# Patient Record
Sex: Male | Born: 1945 | Race: White | Hispanic: No | Marital: Married | Smoking: Never smoker
Health system: Southern US, Community
[De-identification: ages and names within clinical notes are randomized; demographics above are authoritative.]

## PROBLEM LIST (undated history)

## (undated) DIAGNOSIS — I10 Essential (primary) hypertension: Secondary | ICD-10-CM

## (undated) DIAGNOSIS — E7800 Pure hypercholesterolemia, unspecified: Secondary | ICD-10-CM

## (undated) DIAGNOSIS — C801 Malignant (primary) neoplasm, unspecified: Secondary | ICD-10-CM

## (undated) HISTORY — PX: TONSILLECTOMY: SUR1361

---

## 2011-04-12 ENCOUNTER — Ambulatory Visit: Payer: Self-pay | Admitting: Internal Medicine

## 2011-11-02 ENCOUNTER — Other Ambulatory Visit: Payer: Self-pay | Admitting: Urology

## 2011-12-09 ENCOUNTER — Encounter (HOSPITAL_COMMUNITY): Payer: Self-pay | Admitting: Pharmacy Technician

## 2011-12-16 ENCOUNTER — Other Ambulatory Visit: Payer: Self-pay

## 2011-12-16 ENCOUNTER — Ambulatory Visit (HOSPITAL_COMMUNITY)
Admission: RE | Admit: 2011-12-16 | Discharge: 2011-12-16 | Disposition: A | Discharge status: Home / Self Care | Payer: BC Managed Care – PPO | Source: Ambulatory Visit | Attending: Urology | Admitting: Urology

## 2011-12-16 ENCOUNTER — Encounter (HOSPITAL_COMMUNITY)
Admission: RE | Admit: 2011-12-16 | Discharge: 2011-12-16 | Disposition: A | Discharge status: Home / Self Care | Payer: BC Managed Care – PPO | Source: Ambulatory Visit | Attending: Urology | Admitting: Urology

## 2011-12-16 ENCOUNTER — Encounter (HOSPITAL_COMMUNITY): Payer: Self-pay

## 2011-12-16 DIAGNOSIS — Z01818 Encounter for other preprocedural examination: Secondary | ICD-10-CM | POA: Insufficient documentation

## 2011-12-16 DIAGNOSIS — Z01812 Encounter for preprocedural laboratory examination: Secondary | ICD-10-CM | POA: Insufficient documentation

## 2011-12-16 HISTORY — DX: Essential (primary) hypertension: I10

## 2011-12-16 HISTORY — DX: Malignant (primary) neoplasm, unspecified: C80.1

## 2011-12-16 LAB — SURGICAL PCR SCREEN: MRSA, PCR: NEGATIVE

## 2011-12-16 LAB — BASIC METABOLIC PANEL
CO2: 28 mEq/L (ref 19–32)
Calcium: 9.6 mg/dL (ref 8.4–10.5)
Chloride: 102 mEq/L (ref 96–112)
Creatinine, Ser: 0.97 mg/dL (ref 0.50–1.35)
GFR calc Af Amer: >90 mL/min (ref >90)
Sodium: 138 mEq/L (ref 135–145)

## 2011-12-16 LAB — CBC
Platelets: 155 10*3/uL (ref 150–400)
RBC: 4.58 MIL/uL (ref 4.22–5.81)
RDW: 12.6 % (ref 11.5–15.5)
WBC: 5.4 10*3/uL (ref 4.0–10.5)

## 2011-12-16 NOTE — Progress Notes (Signed)
CXR and EKG in Epic from 12-16-2011

## 2011-12-16 NOTE — Progress Notes (Signed)
12/16/11 0829  OBSTRUCTIVE SLEEP APNEA  Have you ever been diagnosed with sleep apnea through a sleep study? No  Do you snore loudly (loud enough to be heard through closed doors)?  1  Do you often feel tired, fatigued, or sleepy during the daytime? 0  Has anyone observed you stop breathing during your sleep? 0  Do you have, or are you being treated for high blood pressure? 1  BMI more than 35 kg/m2? 0  Age over 66 years old? 1  Neck circumference greater than 40 cm/18 inches? 0  Gender: 1  Obstructive Sleep Apnea Score 4   Score 4 or greater  Results sent to PCP

## 2011-12-16 NOTE — Pre-Procedure Instructions (Signed)
20 Andre Arias  12/16/2011   Your procedure is scheduled on:  Wednesday 12-23-2011  Report to [facility] at  AM.  0600    Call this number if you have problems the morning of surgery: [phone]   Remember:   Do not drink liquids or  eat food:After Midnight Tuesday night      Take these medicines the morning of surgery with A SIP OF WATER: None   Do not wear jewelry, make-up or nail polish.  Do not wear lotions, powders, or perfumes. You may wear deodorant.  Do not shave 48 hours prior to surgery. Men may shave face and neck.  Do not bring valuables to the hospital.  Contacts, dentures or bridgework may not be worn into surgery.  Leave suitcase in the car. After surgery it may be brought to your room.  For patients admitted to the hospital, checkout time is 11:00 AM the day of discharge.   Patients discharged the day of surgery will not be allowed to drive home.  Name and phone number of your driver:  [name] wife [phone]  See [facility] Preparing for surgery sheet.   Please read over the following fact sheets that you were given: MRSA Information,Blood transfusion

## 2011-12-23 ENCOUNTER — Encounter (HOSPITAL_COMMUNITY): Payer: Self-pay | Admitting: Anesthesiology

## 2011-12-23 ENCOUNTER — Encounter (HOSPITAL_COMMUNITY): Payer: Self-pay | Admitting: *Deleted

## 2011-12-23 ENCOUNTER — Encounter (HOSPITAL_COMMUNITY)
Admission: RE | Disposition: A | Discharge status: Home / Self Care | Payer: Self-pay | Source: Ambulatory Visit | Attending: Urology

## 2011-12-23 ENCOUNTER — Inpatient Hospital Stay (HOSPITAL_COMMUNITY): Payer: BC Managed Care – PPO | Admitting: Anesthesiology

## 2011-12-23 ENCOUNTER — Inpatient Hospital Stay (HOSPITAL_COMMUNITY)
Admission: RE | Admit: 2011-12-23 | Discharge: 2011-12-25 | DRG: 334 | Disposition: A | Discharge status: Home / Self Care | Payer: BC Managed Care – PPO | Source: Ambulatory Visit | Attending: Urology | Admitting: Urology

## 2011-12-23 DIAGNOSIS — K560 Paralytic ileus: Secondary | ICD-10-CM | POA: Diagnosis not present

## 2011-12-23 DIAGNOSIS — D62 Acute posthemorrhagic anemia: Secondary | ICD-10-CM

## 2011-12-23 DIAGNOSIS — C61 Malignant neoplasm of prostate: Principal | ICD-10-CM | POA: Diagnosis present

## 2011-12-23 DIAGNOSIS — K567 Ileus, unspecified: Secondary | ICD-10-CM

## 2011-12-23 DIAGNOSIS — I10 Essential (primary) hypertension: Secondary | ICD-10-CM | POA: Diagnosis present

## 2011-12-23 HISTORY — PX: LYMPHADENECTOMY: SHX5960

## 2011-12-23 HISTORY — PX: ROBOT ASSISTED LAPAROSCOPIC RADICAL PROSTATECTOMY: SHX5141

## 2011-12-23 LAB — BASIC METABOLIC PANEL
BUN: 12 mg/dL (ref 6–23)
CO2: 25 mEq/L (ref 19–32)
GFR calc non Af Amer: 70 mL/min — ABNORMAL LOW (ref >90)
Glucose, Bld: 155 mg/dL — ABNORMAL HIGH (ref 70–99)
Potassium: 4.1 mEq/L (ref 3.5–5.1)
Sodium: 134 mEq/L — ABNORMAL LOW (ref 135–145)

## 2011-12-23 LAB — TYPE AND SCREEN
ABO/RH(D): O NEG
Antibody Screen: NEGATIVE

## 2011-12-23 LAB — ABO/RH: ABO/RH(D): O NEG

## 2011-12-23 LAB — HEMOGLOBIN AND HEMATOCRIT, BLOOD: HCT: 33 % — ABNORMAL LOW (ref 39.0–52.0)

## 2011-12-23 SURGERY — ROBOTIC ASSISTED LAPAROSCOPIC RADICAL PROSTATECTOMY LEVEL 2
Anesthesia: General | Site: Pelvis | Wound class: Clean Contaminated

## 2011-12-23 MED ORDER — BUPIVACAINE LIPOSOME 1.3 % IJ SUSP
20.0000 mL | INTRAMUSCULAR | Status: AC
  Administered 2011-12-23: 20 mL
  Filled 2011-12-23: qty 20

## 2011-12-23 MED ORDER — GLYCOPYRROLATE 0.2 MG/ML IJ SOLN
INTRAMUSCULAR | Status: DC | PRN
  Administered 2011-12-23: 0.6 mg via INTRAVENOUS
  Administered 2011-12-23: 0.2 mg via INTRAVENOUS

## 2011-12-23 MED ORDER — SODIUM CHLORIDE 0.9 % IV BOLUS (SEPSIS)
1000.0000 mL | Freq: Once | INTRAVENOUS | Status: AC
  Administered 2011-12-23: 1000 mL via INTRAVENOUS

## 2011-12-23 MED ORDER — NEOSTIGMINE METHYLSULFATE 1 MG/ML IJ SOLN
INTRAMUSCULAR | Status: DC | PRN
  Administered 2011-12-23: 5 mg via INTRAVENOUS

## 2011-12-23 MED ORDER — SODIUM CHLORIDE 0.9 % IR SOLN
Status: DC | PRN
  Administered 2011-12-23: 300 mL via INTRAVESICAL

## 2011-12-23 MED ORDER — HYDROMORPHONE HCL PF 1 MG/ML IJ SOLN
INTRAMUSCULAR | Status: DC | PRN
  Administered 2011-12-23 (×4): 0.5 mg via INTRAVENOUS

## 2011-12-23 MED ORDER — LACTATED RINGERS IR SOLN
Status: DC | PRN
Start: 2011-12-23 — End: 2011-12-23
  Administered 2011-12-23: 1000 mL

## 2011-12-23 MED ORDER — ONDANSETRON HCL 4 MG/2ML IJ SOLN
4.0000 mg | Freq: Once | INTRAMUSCULAR | Status: AC
  Administered 2011-12-23: 4 mg via INTRAVENOUS

## 2011-12-23 MED ORDER — HETASTARCH-ELECTROLYTES 6 % IV SOLN
INTRAVENOUS | Status: DC | PRN
  Administered 2011-12-23: 14:00:00 via INTRAVENOUS

## 2011-12-23 MED ORDER — HYDROCHLOROTHIAZIDE 12.5 MG PO CAPS
12.5000 mg | ORAL_CAPSULE | Freq: Every day | ORAL | Status: DC
  Administered 2011-12-24 – 2011-12-25 (×2): 12.5 mg via ORAL
  Filled 2011-12-23 (×2): qty 1

## 2011-12-23 MED ORDER — INDIGOTINDISULFONATE SODIUM 8 MG/ML IJ SOLN
INTRAMUSCULAR | Status: DC | PRN
  Administered 2011-12-23 (×2): 5 mL via INTRAVENOUS

## 2011-12-23 MED ORDER — MORPHINE SULFATE 2 MG/ML IJ SOLN
2.0000 mg | INTRAMUSCULAR | Status: DC | PRN
  Administered 2011-12-23 – 2011-12-24 (×2): 2 mg via INTRAVENOUS
  Filled 2011-12-23 (×2): qty 1

## 2011-12-23 MED ORDER — LIDOCAINE HCL (CARDIAC) 20 MG/ML IV SOLN
INTRAVENOUS | Status: DC | PRN
  Administered 2011-12-23: 30 mg via INTRAVENOUS

## 2011-12-23 MED ORDER — PROMETHAZINE HCL 25 MG/ML IJ SOLN: 6.2500 mg | INTRAMUSCULAR | Status: DC | PRN

## 2011-12-23 MED ORDER — CEFAZOLIN SODIUM-DEXTROSE 2-3 GM-% IV SOLR
2.0000 g | INTRAVENOUS | Status: AC
  Administered 2011-12-23 (×2): 2 g via INTRAVENOUS

## 2011-12-23 MED ORDER — SODIUM CHLORIDE 0.45 % IV SOLN
INTRAVENOUS | Status: DC
  Administered 2011-12-23: 21:00:00 via INTRAVENOUS

## 2011-12-23 MED ORDER — DEXAMETHASONE SODIUM PHOSPHATE 4 MG/ML IJ SOLN
INTRAMUSCULAR | Status: DC | PRN
  Administered 2011-12-23: 10 mg via INTRAVENOUS

## 2011-12-23 MED ORDER — HEPARIN SODIUM (PORCINE) 5000 UNIT/ML IJ SOLN
INTRAMUSCULAR | Status: DC | PRN
  Administered 2011-12-23: 1000 [IU]

## 2011-12-23 MED ORDER — FENTANYL CITRATE 0.05 MG/ML IJ SOLN
INTRAMUSCULAR | Status: DC | PRN
  Administered 2011-12-23 (×3): 50 ug via INTRAVENOUS
  Administered 2011-12-23 (×2): 25 ug via INTRAVENOUS
  Administered 2011-12-23 (×4): 50 ug via INTRAVENOUS
  Administered 2011-12-23 (×2): 25 ug via INTRAVENOUS
  Administered 2011-12-23 (×3): 50 ug via INTRAVENOUS
  Administered 2011-12-23 (×2): 25 ug via INTRAVENOUS

## 2011-12-23 MED ORDER — HYDROMORPHONE HCL PF 1 MG/ML IJ SOLN
0.2500 mg | INTRAMUSCULAR | Status: DC | PRN
  Administered 2011-12-23 (×2): 0.5 mg via INTRAVENOUS

## 2011-12-23 MED ORDER — CISATRACURIUM BESYLATE (PF) 10 MG/5ML IV SOLN
INTRAVENOUS | Status: DC | PRN
  Administered 2011-12-23: 3 mg via INTRAVENOUS
  Administered 2011-12-23: 1 mg via INTRAVENOUS
  Administered 2011-12-23: 4 mg via INTRAVENOUS
  Administered 2011-12-23 (×3): 3 mg via INTRAVENOUS
  Administered 2011-12-23: 10 mg via INTRAVENOUS
  Administered 2011-12-23 (×2): 3 mg via INTRAVENOUS
  Administered 2011-12-23: 1 mg via INTRAVENOUS
  Administered 2011-12-23: 2 mg via INTRAVENOUS
  Administered 2011-12-23: 3 mg via INTRAVENOUS
  Administered 2011-12-23: 2 mg via INTRAVENOUS

## 2011-12-23 MED ORDER — SUCCINYLCHOLINE CHLORIDE 20 MG/ML IJ SOLN
INTRAMUSCULAR | Status: DC | PRN
  Administered 2011-12-23: 100 mg via INTRAVENOUS

## 2011-12-23 MED ORDER — ONDANSETRON HCL 4 MG/2ML IJ SOLN
INTRAMUSCULAR | Status: DC | PRN
  Administered 2011-12-23 (×2): 2 mg via INTRAVENOUS

## 2011-12-23 MED ORDER — ACETAMINOPHEN 10 MG/ML IV SOLN
1000.0000 mg | Freq: Four times a day (QID) | INTRAVENOUS | Status: AC
  Administered 2011-12-24 (×3): 1000 mg via INTRAVENOUS
  Filled 2011-12-23 (×4): qty 100

## 2011-12-23 MED ORDER — LACTATED RINGERS IV SOLN
INTRAVENOUS | Status: DC | PRN
  Administered 2011-12-23 (×3): via INTRAVENOUS

## 2011-12-23 MED ORDER — MIDAZOLAM HCL 5 MG/5ML IJ SOLN
INTRAMUSCULAR | Status: DC | PRN
  Administered 2011-12-23: 0.5 mg via INTRAVENOUS
  Administered 2011-12-23: 1 mg via INTRAVENOUS

## 2011-12-23 MED ORDER — ACETAMINOPHEN 10 MG/ML IV SOLN
INTRAVENOUS | Status: DC | PRN
  Administered 2011-12-23: 1000 mg via INTRAVENOUS

## 2011-12-23 MED ORDER — HYDROCODONE-ACETAMINOPHEN 5-325 MG PO TABS
1.0000 | ORAL_TABLET | ORAL | Status: DC | PRN
  Administered 2011-12-23: 1 via ORAL
  Filled 2011-12-23: qty 1

## 2011-12-23 MED ORDER — EPHEDRINE SULFATE 50 MG/ML IJ SOLN
INTRAMUSCULAR | Status: DC | PRN
  Administered 2011-12-23: 5 mg via INTRAVENOUS
  Administered 2011-12-23 (×2): 10 mg via INTRAVENOUS
  Administered 2011-12-23: 5 mg via INTRAVENOUS
  Administered 2011-12-23 (×2): 10 mg via INTRAVENOUS
  Administered 2011-12-23 (×3): 5 mg via INTRAVENOUS

## 2011-12-23 MED ORDER — PROPOFOL 10 MG/ML IV EMUL
INTRAVENOUS | Status: DC | PRN
  Administered 2011-12-23: 200 mg via INTRAVENOUS

## 2011-12-23 MED ORDER — CEFAZOLIN SODIUM-DEXTROSE 2-3 GM-% IV SOLR: 2.0000 g | Freq: Once | INTRAVENOUS | Status: DC

## 2011-12-23 MED ORDER — LISINOPRIL 10 MG PO TABS
10.0000 mg | ORAL_TABLET | Freq: Every day | ORAL | Status: DC
  Administered 2011-12-24 – 2011-12-25 (×2): 10 mg via ORAL
  Filled 2011-12-23 (×2): qty 1

## 2011-12-23 MED ORDER — LISINOPRIL-HYDROCHLOROTHIAZIDE 10-12.5 MG PO TABS: 1.0000 | ORAL_TABLET | Freq: Every morning | ORAL | Status: DC

## 2011-12-23 MED ORDER — HEPARIN SODIUM (PORCINE) 5000 UNIT/ML IJ SOLN
5000.0000 [IU] | INTRAMUSCULAR | Status: AC
  Administered 2011-12-23: 5000 [IU] via SUBCUTANEOUS
  Filled 2011-12-23: qty 1

## 2011-12-23 SURGICAL SUPPLY — 52 items
APPLIER CLIP 5 13 M/L LIGAMAX5 (MISCELLANEOUS) ×3
CANISTER SUCTION 2500CC (MISCELLANEOUS) ×3 IMPLANT
CATH FOLEY 2WAY SLVR  5CC 16FR (CATHETERS) ×1
CATH FOLEY 2WAY SLVR 30CC 16FR (CATHETERS) ×3 IMPLANT
CATH FOLEY 2WAY SLVR 5CC 16FR (CATHETERS) ×2 IMPLANT
CATH ROBINSON RED A/P 8FR (CATHETERS) ×3 IMPLANT
CATH TIEMANN FOLEY 18FR 5CC (CATHETERS) ×6 IMPLANT
CHLORAPREP W/TINT 26ML (MISCELLANEOUS) ×3 IMPLANT
CLIP APPLIE 5 13 M/L LIGAMAX5 (MISCELLANEOUS) ×2 IMPLANT
CLIP LIGATING HEM O LOK PURPLE (MISCELLANEOUS) ×12 IMPLANT
CLOTH BEACON ORANGE TIMEOUT ST (SAFETY) ×3 IMPLANT
CORD HIGH FREQUENCY UNIPOLAR (ELECTROSURGICAL) IMPLANT
COVER SURGICAL LIGHT HANDLE (MISCELLANEOUS) ×3 IMPLANT
COVER TIP SHEARS 8 DVNC (MISCELLANEOUS) ×2 IMPLANT
COVER TIP SHEARS 8MM DA VINCI (MISCELLANEOUS) ×1
CUTTER ECHEON FLEX ENDO 45 340 (ENDOMECHANICALS) ×3 IMPLANT
DECANTER SPIKE VIAL GLASS SM (MISCELLANEOUS) ×3 IMPLANT
DRAPE SURG IRRIG POUCH 19X23 (DRAPES) ×3 IMPLANT
DRSG TEGADERM 6X8 (GAUZE/BANDAGES/DRESSINGS) ×6 IMPLANT
ELECT REM PT RETURN 9FT ADLT (ELECTROSURGICAL) ×3
ELECTRODE REM PT RTRN 9FT ADLT (ELECTROSURGICAL) ×2 IMPLANT
GLOVE BIOGEL M 7.0 STRL (GLOVE) IMPLANT
GLOVE BIOGEL PI IND STRL 7.5 (GLOVE) ×4 IMPLANT
GLOVE BIOGEL PI INDICATOR 7.5 (GLOVE) ×2
GLOVE ECLIPSE 7.0 STRL STRAW (GLOVE) ×6 IMPLANT
GOWN PREVENTION PLUS XLARGE (GOWN DISPOSABLE) ×6 IMPLANT
GOWN STRL NON-REIN LRG LVL3 (GOWN DISPOSABLE) ×18 IMPLANT
HEMOSTAT SURGICEL 2X14 (HEMOSTASIS) ×3 IMPLANT
HOLDER FOLEY CATH W/STRAP (MISCELLANEOUS) ×3 IMPLANT
IV LACTATED RINGERS 1000ML (IV SOLUTION) ×3 IMPLANT
KIT ACCESSORY DA VINCI DISP (KITS) ×1
KIT ACCESSORY DVNC DISP (KITS) ×2 IMPLANT
NDL SAFETY ECLIPSE 18X1.5 (NEEDLE) ×2 IMPLANT
NEEDLE HYPO 18GX1.5 SHARP (NEEDLE) ×1
PACK ROBOT UROLOGY CUSTOM (CUSTOM PROCEDURE TRAY) ×3 IMPLANT
POSITIONER SURGICAL ARM (MISCELLANEOUS) IMPLANT
RELOAD GREEN ECHELON 45 (STAPLE) ×3 IMPLANT
SET TUBE IRRIG SUCTION NO TIP (IRRIGATION / IRRIGATOR) ×3 IMPLANT
SOLUTION ELECTROLUBE (MISCELLANEOUS) ×3 IMPLANT
SPONGE GAUZE 4X4 12PLY (GAUZE/BANDAGES/DRESSINGS) ×3 IMPLANT
SUT ETHILON 3 0 PS 1 (SUTURE) ×3 IMPLANT
SUT VIC AB 2-0 SH 27 (SUTURE) ×3
SUT VIC AB 2-0 SH 27X BRD (SUTURE) ×6 IMPLANT
SUT VIC AB 3-0 SH 27 (SUTURE) ×4
SUT VIC AB 3-0 SH 27X BRD (SUTURE) ×6 IMPLANT
SUT VIC AB 3-0 SH 27XBRD (SUTURE) ×2 IMPLANT
SUT VICRYL 0 UR6 27IN ABS (SUTURE) ×6 IMPLANT
SUT VICRYL 3 0 UR 6 27 (SUTURE) ×6 IMPLANT
SYR 27GX1/2 1ML LL SAFETY (SYRINGE) ×3 IMPLANT
TOWEL OR 17X26 10 PK STRL BLUE (TOWEL DISPOSABLE) ×3 IMPLANT
TOWEL OR NON WOVEN STRL DISP B (DISPOSABLE) ×3 IMPLANT
WATER STERILE IRR 1500ML POUR (IV SOLUTION) ×6 IMPLANT

## 2011-12-23 NOTE — H&P (Signed)
Urology History and Physical Exam  CC: Prostate cancer  HPI: 66 year old male presents with prostate cancer for robotic laparoscopic radical prostatectomy and bilateral pelvic lymph node dissection with nerve sparing to be determined at the time of surgery. This was diagnosed on prostate biopsy in August 2013. Final Gleason score was 4+3 equals 7 in one core and 3+3 equals 6 in one core. Prostate volume was 75 cc. PSA was 11 in July 2013. CT of the pelvis in August 2013 for staging showed no evidence of metastatic disease. He did have prominent seminal vesicles and a large median lobe. Staging studies with labs showed no evidence of metastatic disease. This has not been associated with gross hematuria. Nothing makes it better or worse.  We have discussed the risks, benefits, alternatives, and likelihood of achieving his goals.  PMH: Past Medical History  Diagnosis Date  . Hypertension   . Cancer     prostate    PSH: Past Surgical History  Procedure Date  . Tonsillectomy     as child    Allergies: No Known Allergies  Medications: Prescriptions prior to admission  Medication Sig Dispense Refill  . lisinopril-hydrochlorothiazide (PRINZIDE,ZESTORETIC) 10-12.5 MG per tablet Take 1 tablet by mouth every morning.      . Tamsulosin HCl (FLOMAX) 0.4 MG CAPS Take 0.4 mg by mouth daily.      . Multiple Vitamin (MULTIVITAMIN WITH MINERALS) TABS Take 1 tablet by mouth daily.         Social History: History   Social History  . Marital Status: Married    Spouse Name: N/A    Number of Children: N/A  . Years of Education: N/A   Occupational History  . Not on file.   Social History Main Topics  . Smoking status: Never Smoker   . Smokeless tobacco: Never Used  . Alcohol Use: No  . Drug Use: No  . Sexually Active:    Other Topics Concern  . Not on file   Social History Narrative  . No narrative on file    Family History: History reviewed. No pertinent family  history.  Review of Systems: Positive: None. Negative: Fever, chest pain, or SOB.  A further 10 point review of systems was negative except what is listed in the HPI.  Physical Exam: Filed Vitals:   12/23/11 0604  BP: 162/83  Pulse: 66  Temp: 97.8 F (36.6 C)  Resp: 18    General: No acute distress.  Awake. Head:  Normocephalic.  Atraumatic. ENT:  EOMI.  Mucous membranes moist Neck:  Supple.  No lymphadenopathy. Pulmonary: Equal effort bilaterally.  Clear to auscultation bilaterally. Abdomen: Soft.  Non- tender to palpation. Skin:  Normal turgor.  No visible rash. Extremity: No gross deformity of bilateral upper extremities.  No gross deformity of    bilateral lower extremities. Neurologic: Alert. Appropriate mood.    Studies:  No results found for this basename: HGB:2,WBC:2,PLT:2 in the last 72 hours  No results found for this basename: NA:2,K:2,CL:2,CO2:2,BUN:2,CREATININE:2,CALCIUM:2,MAGNESIUM:2,GFRNONAA:2,GFRAA:2 in the last 72 hours   No results found for this basename: PT:2,INR:2,APTT:2 in the last 72 hours   No components found with this basename: ABG:2    Assessment:  Prostate cancer  Plan: To OR for robotic laparoscopic radical prostatectomy and bilateral pelvic lymph node dissection with nerve sparing to be determined at the time of surgery.

## 2011-12-23 NOTE — Transfer of Care (Signed)
Immediate Anesthesia Transfer of Care Note  Patient: Andre Arias  Procedure(s) Performed: Procedure(s) (LRB) with comments: ROBOTIC ASSISTED LAPAROSCOPIC RADICAL PROSTATECTOMY LEVEL 2 (N/A) -    LYMPHADENECTOMY (Bilateral)  Patient Location: PACU  Anesthesia Type:General  Level of Consciousness: awake, sedated and patient cooperative  Airway & Oxygen Therapy: Patient Spontanous Breathing and Patient connected to face mask oxygen  Post-op Assessment: Report given to PACU RN and Post -op Vital signs reviewed and stable  Post vital signs: Reviewed and stable  Complications: No apparent anesthesia complications

## 2011-12-23 NOTE — Anesthesia Preprocedure Evaluation (Signed)
Anesthesia Evaluation  Patient identified by MRN, date of birth, ID band Patient awake    Reviewed: Allergy & Precautions, H&P , NPO status , Patient's Chart, lab work & pertinent test results  Airway Mallampati: II TM Distance: >3 FB Neck ROM: Full    Dental No notable dental hx.    Pulmonary neg pulmonary ROS,  breath sounds clear to auscultation  Pulmonary exam normal       Cardiovascular hypertension, Pt. on medications Rhythm:Regular Rate:Normal     Neuro/Psych negative neurological ROS  negative psych ROS   GI/Hepatic negative GI ROS, Neg liver ROS,   Endo/Other  negative endocrine ROS  Renal/GU negative Renal ROS  negative genitourinary   Musculoskeletal negative musculoskeletal ROS (+)   Abdominal   Peds negative pediatric ROS (+)  Hematology negative hematology ROS (+)   Anesthesia Other Findings   Reproductive/Obstetrics negative OB ROS                           Anesthesia Physical Anesthesia Plan  ASA: II  Anesthesia Plan: General   Post-op Pain Management:    Induction: Intravenous  Airway Management Planned: Oral ETT  Additional Equipment:   Intra-op Plan:   Post-operative Plan: Extubation in OR  Informed Consent: I have reviewed the patients History and Physical, chart, labs and discussed the procedure including the risks, benefits and alternatives for the proposed anesthesia with the patient or authorized representative who has indicated his/her understanding and acceptance.   Dental advisory given  Plan Discussed with: CRNA and Surgeon  Anesthesia Plan Comments:         Anesthesia Quick Evaluation  

## 2011-12-23 NOTE — Op Note (Signed)
Urology Operative Report  DATE OF PROCEDURE: 12/23/11   OPERATIVE REPORT  SURGEON: Natalia Leatherwood, MD  ASSISTANT:  Pecola Leisure, PA  PREOPERATIVE DIAGNOSIS: Prostate cancer.  POSTOPERATIVE DIAGNOSIS: Prostate cancer.  PROCEDURE PERFORMED: Robotic-assisted laparoscopic radical  Prostatectomy,bilateral pelvic lymph node dissection, bilateral non-nerve sparing approach.  ESTIMATED BLOOD LOSS: 500 cc.  DRAINS: Jackson-Pratt drain, left lower quadrant and Foley catheter.  SPECIMEN: Prostate sent with seminal vesicles in its entirety.  FINDINGS: Large periprostatic veins. Intravesicular prostate.  Marland Kitchen  HISTORY OF PRESENT ILLNESS:  66 year old male had a prostate biopsy due to elevated PSA. He was discovered to have prostate cancer. After evaluation and discussion of management options, he elected for surgical extirpation of his prostate.   PROCEDURE IN DETAIL: Informed consent was obtained. He received subcutaneous heparin for DVT prophylaxis before being taken to the OR. The patient was taken to the operating room, where he was placed in the supine position. IV antibiotics were infused and general anesthesia was induced. A time- out was performed, in which the correct patient, surgical site, and procedure were identified and agreed upon by the team. Hair was removed  from his abdomen and genitals and then he was placed in a dorsal  lithotomy position. All pertinent neurovascular pressure points were padded appropriately. His arms were tucked to the side using gel padding. After this, his abdomen and genitals were prepped and draped in the usual sterile fashion. A Foley catheter was placed on the field, 10 cc of sterile water was placed into the balloon.   Access was gained for laparoscopic procedure by using the Hasson technique by cutting down to the fascia in the midline above the umbilicus. Once the peritoneum was accessed, a 10 mm  port was placed and abdomen was insufflated with carbon  dioxide. Opening pressures were initially low so insufflation was continued. Next, markings were made for placement of the 1st & 3rd robotic arm ports in the usual areas with the ports being placed approximately 10 cm from the midline on either  side of camera (2nd) arm.  A 10 mm assistant port was placed on the far right lateral side of the abdomen. A 5 mm assistant port was placed between the right robotic (1st arm) and the camera port (2nd arm). The 4th robotic arm port was placed at the far left lateral side of the abdomen. All these were placed under direct visualization.   He was then placed in a Trendelenburg position, and the robot was docked to the robotic ports.  After this was done, the urachal remnant was identified and divided and the bladder was  Dissected from the anterior wall of the abdomen. This was taken down to  the endopelvic fascia bilaterally and the perineum was split down to the  vas deferens bilaterally. The vas deferens was divided bipolar and monopolar electrocautery. After this was done, the prograsp was used to  retract the bladder cephalad.   Attention was turned to the right pelvis. The peritoneum was incised until the right external iliac vein was identified. Careful dissection was performed to release the nodal packet lateral to the vein. Dissection was then carried out inferior and medially to the vein until the lateral pelvic wall was encountered. Gentle dissection was then carried proximally and distally along the vein. The obturator nerve was identified early at its proximal and distal ends. Dissection of this packet was carried out making sure not to cause injury to the obturator nerve. Hemostasis and lymph vessels were controlled with  metallic surgical clips. The lymph node packet was then removed and sent for permanent pathology.  Attention was turned to the left pelvis. The peritoneum was incised until the left external iliac vein was identified. Careful  dissection was performed to release the nodal packet lateral to the vein. Dissection was then carried out inferior and medially to the vein until the lateral pelvic wall was encountered. Gentle dissection was then carried proximally and distally along the vein. The obturator nerve was identified early at its proximal and distal ends. Dissection of this packet was carried out making sure not to cause injury to the obturator nerve. Hemostasis and lymph vessels were controlled with metallic surgical clips. The lymph node packet was then removed and sent for permanent pathology.   After this was done, the endopelvic fascia was incised  bilaterally and carried anterior and posteriorly bilaterally. There was a large amount of bleeding from the right periprosthetic fossa while dissecting the Endo pelvic fascia. The  puboprostatic ligaments were then divided and then the stapler device  was used to divide and ligate the dorsal venous complex.   After this was complete, attention was turned back to the junction between the prostate and bladder neck.  Dissection was carried down between the prostate and the bladder until  the Foley catheter was encountered. It was deflated and then placed  through the dissection site and then anterior traction was placed by  grasping the catheter with the 4th robot arm. The remainder of the bladder neck  was then dissected out, and dissection was carried down posteriorly making sure to avoid reentry into the bladder until the seminal vesicles were encountered. It should be noted that the patient had a very large intravesical portion which greatly distorted the bladder neck. His took approximately twice as long as normal to dissect out the posterior prostate and locate the seminal vesicles. Because of this intravesical prostate, there was a large defect in the bladder neck. Required bladder neck tailoring later in the case. Indigo carmine was given to the patient and both ureters  were identified before completing this portion of the dissection.  The seminal vesicles and vas deferens were then dissected out completely bilaterally. After  these were done, they were placed on traction anteriorly and blunt  dissection was carried out between the rectum and the prostate.   Attention was turned to the left prostatic pedicle. It was evaluated and found to have no good surgical plane.  It was felt that further attempt at nerve sparing on this left side would jeopardize cancer control and a wide dissection of this side was carried out. The left prostatic pedicle was controlled with sharp dissection and Hem-o-lok clips. Attention was turned to the right prostatic pedicle. It was evaluated and found to have no good surgical plane.  It was felt that further attempt at nerve sparing on this rightt side would jeopardize cancer control and a wide dissection of this side was carried out. The left prostatic pedicle was controlled with sharp dissection and Hem-o-lok clips.    Dissection was then carried up to the urethra which was all that remained attaching the prostate to the body. The urethra was  then divided with scissors and the prostate was placed in a laparoscopic bag.  The pelvis was irrigated and flushing of air into the rectal catheter showed no air bubbles.   After this, anastomosis of the bladder neck to the urethra was carried out. First I performed bladder neck tailoring with interrupted  figure-of-eight sutures at 3:00 and 9:00. The anastomosis took approximately twice as long as normal due to the distortion of the bladder neck. Double-armed Monocryl suture was used in a running  fashion to perform the anastomosis. After this was done, a Foley  catheter was placed through the anastomosis and into the bladder. It was irrigated and found to have a very small leak in the left side. This was oversewed with a 3-0 figure-of-eight Vicryl. The bladder was again irrigated in this area no  longer leaked. Was a very small amount of fluid coming from the posterior portion of the anastomosis. As this could not be reached it was felt that it was such a small leakage that this would resolve on its own with catheter drainage. It was tied down and the suture needles were removed from the body.   A Jackson-Pratt drain was placed through the port of the 4th robot arm. The specimen was placed in the EndoCatch bag. The robot was  undocked and the operating table was leveled. The EndoCatch bag was removed from the body by enlarging  the midline of the umbilicus. All ports were checked and found to be  free of bleeding. The 10 mm assistant port was closed with a suture  passer under direct visualization. The fascia of the umbilical port was close with interrupted vicryl suture in a figure-of-eight fashion until there was no defect palpable. Care was taken to avoid incorporation of intra-abdominal contents into the closure. After this was done, the drain was sutured into place, and the local injection with Exparel was performed. All wounds were irrigated with  sterile normal saline and then closed with staples and dressed with a sterile dressing.  Tegaderm and drain gauze was placed over the Jackson-Pratt drain and  Foley catheter was placed to closed drainage. The patient was placed  back in supine position. Anesthesia was reversed. He was taken to PACU  in stable condition. He will be kept overnight for evaluation.

## 2011-12-23 NOTE — Progress Notes (Signed)
Pt's BP been running in 170s/90s and last HR 105 at 21:44. Notified MD on call. MD ordered to continue VS monitoring as scheduled (Q4H) and call again if BP or HR higher than last values. Pt to receive scheduled lisinopril and hydrochlorothiazide in am. Will continue to monitor pt. - Christell Faith, RN

## 2011-12-23 NOTE — Anesthesia Postprocedure Evaluation (Signed)
  Anesthesia Post-op Note  Patient: Andre Arias  Procedure(s) Performed: Procedure(s) (LRB): ROBOTIC ASSISTED LAPAROSCOPIC RADICAL PROSTATECTOMY LEVEL 2 (N/A) LYMPHADENECTOMY (Bilateral)  Patient Location: PACU  Anesthesia Type: General  Level of Consciousness: awake and alert   Airway and Oxygen Therapy: Patient Spontanous Breathing  Post-op Pain: mild  Post-op Assessment: Post-op Vital signs reviewed, Patient's Cardiovascular Status Stable, Respiratory Function Stable, Patent Airway and No signs of Nausea or vomiting  Post-op Vital Signs: stable  Complications: No apparent anesthesia complications

## 2011-12-23 NOTE — OR Nursing (Signed)
Foley catheter taped in traction by dr. Margarita Grizzle

## 2011-12-24 ENCOUNTER — Encounter (HOSPITAL_COMMUNITY): Payer: Self-pay | Admitting: Urology

## 2011-12-24 LAB — BASIC METABOLIC PANEL
BUN: 12 mg/dL (ref 6–23)
CO2: 24 mEq/L (ref 19–32)
Calcium: 7.9 mg/dL — ABNORMAL LOW (ref 8.4–10.5)
Creatinine, Ser: 0.99 mg/dL (ref 0.50–1.35)
GFR calc non Af Amer: 83 mL/min — ABNORMAL LOW (ref >90)
Glucose, Bld: 162 mg/dL — ABNORMAL HIGH (ref 70–99)
Sodium: 131 mEq/L — ABNORMAL LOW (ref 135–145)

## 2011-12-24 LAB — CREATININE, FLUID (PLEURAL, PERITONEAL, JP DRAINAGE)

## 2011-12-24 MED ORDER — HYDROMORPHONE HCL PF 1 MG/ML IJ SOLN
1.0000 mg | INTRAMUSCULAR | Status: DC | PRN
  Administered 2011-12-24: 1 mg via INTRAVENOUS
  Filled 2011-12-24: qty 1

## 2011-12-24 MED ORDER — HEPARIN SODIUM (PORCINE) 5000 UNIT/ML IJ SOLN
5000.0000 [IU] | Freq: Three times a day (TID) | INTRAMUSCULAR | Status: DC
  Administered 2011-12-24 – 2011-12-25 (×2): 5000 [IU] via SUBCUTANEOUS
  Filled 2011-12-24 (×5): qty 1

## 2011-12-24 MED ORDER — ONDANSETRON HCL 4 MG/2ML IJ SOLN
INTRAMUSCULAR | Status: AC
  Filled 2011-12-24: qty 2

## 2011-12-24 MED ORDER — OXYCODONE HCL 5 MG PO TABS: 5.0000 mg | ORAL_TABLET | ORAL | Status: DC | PRN

## 2011-12-24 MED ORDER — ONDANSETRON HCL 4 MG/2ML IJ SOLN
4.0000 mg | INTRAMUSCULAR | Status: DC | PRN
  Administered 2011-12-24: 4 mg via INTRAVENOUS

## 2011-12-24 MED ORDER — SENNOSIDES-DOCUSATE SODIUM 8.6-50 MG PO TABS
1.0000 | ORAL_TABLET | Freq: Two times a day (BID) | ORAL | Status: DC
  Administered 2011-12-24 – 2011-12-25 (×3): 1 via ORAL
  Filled 2011-12-24 (×4): qty 1

## 2011-12-24 MED ORDER — BISACODYL 10 MG RE SUPP
10.0000 mg | Freq: Two times a day (BID) | RECTAL | Status: DC
  Administered 2011-12-24 – 2011-12-25 (×3): 10 mg via RECTAL
  Filled 2011-12-24 (×3): qty 1

## 2011-12-24 MED ORDER — SODIUM CHLORIDE 0.9 % IV SOLN
INTRAVENOUS | Status: DC
  Administered 2011-12-24 – 2011-12-25 (×3): via INTRAVENOUS

## 2011-12-24 MED ORDER — SIMETHICONE 80 MG PO CHEW
80.0000 mg | CHEWABLE_TABLET | Freq: Four times a day (QID) | ORAL | Status: DC | PRN
  Administered 2011-12-24 – 2011-12-25 (×2): 80 mg via ORAL
  Filled 2011-12-24 (×3): qty 1

## 2011-12-24 NOTE — Progress Notes (Signed)
Urology Progress Note  Subjective:     No acute urologic events overnight. Patient on bed rest. Complains of pain and JP site moderately controlled with IV pain medication. Tolerating clear liquids. Negative flatus or BM.  I discussed with the patient the course of the surgery and the surgical findings. I explained the need for bed rest and mild catheter traction. We discussed DVT prophylaxis and I explained that he is currently receiving prophylaxis with SCDs and TED hose. We discussed the use of medicines that in the blood, such as heparin and Lovenox. We discussed the risks, and benefits. He understands that a DVT could cause a pulmonary embolus and death, but that risk is low. We also discussed his risk factors.  ROS: Negative: Chest pain or SOB.  Objective:  Patient Vitals for the past 24 hrs:  BP Temp Temp src Pulse Resp SpO2 Height Weight  12/24/11 0545 155/87 mmHg 98 F (36.7 C) Oral 98  16  100 % 5\' 9"  (1.753 m) -  12/24/11 0231 138/84 mmHg 97.9 F (36.6 C) Oral 93  16  99 % - -  12/23/11 2144 171/93 mmHg 97.5 F (36.4 C) Oral 105  20  98 % - -  12/23/11 1800 176/86 mmHg 97.6 F (36.4 C) Oral 85  12  100 % - -  12/23/11 1745 172/91 mmHg - - 94  14  100 % - -  12/23/11 1730 173/83 mmHg 97.6 F (36.4 C) - 88  12  100 % - -  12/23/11 1715 178/87 mmHg - - 91  13  100 % - -  12/23/11 1700 160/80 mmHg 97.6 F (36.4 C) - 92  12  100 % - -  12/23/11 1645 177/90 mmHg - - 88  12  100 % - -  12/23/11 1630 175/86 mmHg - - 98  12  100 % - -  12/23/11 1616 163/90 mmHg 97.5 F (36.4 C) - 98  12  100 % - -  12/23/11 0604 162/83 mmHg 97.8 F (36.6 C) Oral 66  18  98 % - -    Physical Exam: General:  No acute distress, awake Cardiovascular:    [x]   S1/S2 present, RRR  []   Irregularly irregular Chest:  CTA-B Abdomen:               []  Soft, appropriately TTP  []  Soft, NTTP  [x]  Soft, appropriately TTP, mild to moderate distention, no rebound TTP, incision(s) dressed, JP  serosanguinous.  Genitourinary: Negative edema, foley in place on mild traction. Foley:  Clear yellow urine.    Output: Urine 915cc, JP 100cc  Recent Labs  [facility] 12/24/11 0450 12/23/11 1625   HGB 11.6* 11.8*   WBC -- --   PLT -- --    Recent Labs  [facility] 12/24/11 0450 12/23/11 1625   NA 131* 134*   K 4.3 4.1   CL 98 100   CO2 24 25   BUN 12 12   CREATININE 0.99 1.08   CALCIUM 7.9* 8.1*   GFRNONAA 83* 70*   GFRAA >90 81*     No results found for this basename: PT:2,INR:2,APTT:2 in the last 72 hours   No components found with this basename: ABG:2    Length of stay: 1 days.  Assessment: Prostate cancer. Acute blood loss anemia. POD#1 Robotic radical prostatectomy with bilateral pelvic lymph node dissection.   Plan: -Anemia: Hemoglobin stable compared to post-operative level. Continue to monitor. -Discontinue telemetry and continuous pulse ox  as patient is no longer lethargic from surgery. -I recommend holding heparin. Continue SCD & TED hose for DVT prophylaxis. The patient agrees with holding heparin. -Continue senokot-S, add dulcolax suppository for bowels. -Continue IV fluids with NS at 100 mL/hour. -Hold at clear liquid diet until he has flatus. -Continue bed rest and catheter traction for 24 hours. Will reassess this afternoon. -Change morphine to dilaudid for better pain control.   Natalia Leatherwood, MD [phone]

## 2011-12-24 NOTE — Progress Notes (Signed)
GU  No flatus. Continues on bedrest. Complains of gas pain. No incisional pain.   Filed Vitals:   12/24/11 1410  BP: 160/81  Pulse: 80  Temp: 98.1 F (36.7 C)  Resp: 16   Gen: NAD, AAO Abd: Moderate distention, soft, no rebound TTP, JP serosanguinous. GU: foley in place, draining clear liquid  Assessment:  Prostate cancer Ileus  Plan:  JP fluid for stat creatinine. Ambulate for now, but if Cr in JP elevated, will have to go back to bedrest. Will order patient to be switched to inpatient status.

## 2011-12-24 NOTE — Care Management Note (Unsigned)
    Page 1 of 1   12/24/2011     1:18:03 PM   CARE MANAGEMENT NOTE 12/24/2011  Patient:  Andre Arias, Andre Arias   Account Number:  [id number]  Date Initiated:  12/24/2011  Documentation initiated by:  Lanier Clam  Subjective/Objective Assessment:   ADMITTED W/PROSTATE CA.     Action/Plan:   FROM HOME W/SPOUSE.   Anticipated DC Date:  12/25/2011   Anticipated DC Plan:  HOME/SELF CARE      DC Planning Services  CM consult      Choice offered to / List presented to:             Status of service:  In process, will continue to follow Medicare Important Message given?   (If response is "NO", the following Medicare IM given date fields will be blank) Date Medicare IM given:   Date Additional Medicare IM given:    Discharge Disposition:    Per UR Regulation:  Reviewed for med. necessity/level of care/duration of stay  If discussed at Long Length of Stay Meetings, dates discussed:    Comments:  12/24/11 [facility] Ranesha Val RN,BSN NCM [phone]

## 2011-12-25 LAB — BASIC METABOLIC PANEL
BUN: 13 mg/dL (ref 6–23)
CO2: 28 mEq/L (ref 19–32)
Calcium: 8.7 mg/dL (ref 8.4–10.5)
Chloride: 104 mEq/L (ref 96–112)
Creatinine, Ser: 0.99 mg/dL (ref 0.50–1.35)
Glucose, Bld: 114 mg/dL — ABNORMAL HIGH (ref 70–99)

## 2011-12-25 LAB — HEMOGLOBIN AND HEMATOCRIT, BLOOD: Hemoglobin: 12.4 g/dL — ABNORMAL LOW (ref 13.0–17.0)

## 2011-12-25 MED ORDER — HYOSCYAMINE SULFATE 0.125 MG PO TABS: 0.1250 mg | ORAL_TABLET | ORAL | Status: DC | PRN

## 2011-12-25 MED ORDER — BACIT-POLY-NEO HC 1 % EX OINT: TOPICAL_OINTMENT | Freq: Three times a day (TID) | CUTANEOUS | Status: DC

## 2011-12-25 MED ORDER — CIPROFLOXACIN HCL 500 MG PO TABS: 250.0000 mg | ORAL_TABLET | Freq: Two times a day (BID) | ORAL | Status: DC

## 2011-12-25 MED ORDER — SENNOSIDES-DOCUSATE SODIUM 8.6-50 MG PO TABS: 1.0000 | ORAL_TABLET | Freq: Two times a day (BID) | ORAL | Status: DC

## 2011-12-25 MED ORDER — BISACODYL 10 MG RE SUPP: 10.0000 mg | Freq: Two times a day (BID) | RECTAL | Status: DC

## 2011-12-25 MED ORDER — OXYBUTYNIN CHLORIDE 5 MG PO TABS: 5.0000 mg | ORAL_TABLET | Freq: Four times a day (QID) | ORAL | Status: DC | PRN

## 2011-12-25 MED ORDER — OXYCODONE-ACETAMINOPHEN 5-325 MG PO TABS: 1.0000 | ORAL_TABLET | ORAL | Status: DC | PRN

## 2011-12-25 NOTE — Progress Notes (Signed)
Urology Progress Note  Subjective:     No acute urologic events overnight. Tolerating clear liquids. Negative nausea. Abdominal gas pain much improved after ambulation.    JP fluid was checked for Cr and was found to be consistent with serum. Ambulation initiated last night as well as heparin for DVT prophylaxis.  Patient feeling much better this morning.  Had a liquid BM this morning.   ROS: Negative: Chest pain or SOB.  Objective:  Patient Vitals for the past 24 hrs:  BP Temp Temp src Pulse Resp SpO2 Weight  12/25/11 0440 170/87 mmHg 98.8 F (37.1 C) Oral 94  16  97 % -  12/24/11 2205 169/72 mmHg 98 F (36.7 C) Oral 86  16  98 % -  12/24/11 1800 - - - - - - 91.808 kg (202 lb 6.4 oz)  12/24/11 1410 160/81 mmHg 98.1 F (36.7 C) Oral 80  16  98 % -  12/24/11 1020 152/80 mmHg 98.2 F (36.8 C) Oral 81  16  97 % -    Physical Exam: General:  No acute distress, awake Cardiovascular:    [x]   S1/S2 present, RRR  []   Irregularly irregular Chest:  CTA-B Abdomen:               []  Soft, appropriately TTP  []  Soft, NTTP  [x]  Soft, appropriately TTP, non-distended, no rebound TTP, incision(s) clean/dry/intact, JP serosanguinous.  Genitourinary: Negative edema, foley in place. Foley:  Clear amber urine.    Output: Urine 6250 cc, JP 495 cc  Recent Labs  [facility] 12/25/11 0449 12/24/11 0450   HGB 12.4* 11.6*   WBC -- --   PLT -- --    Recent Labs  Basename 12/25/11 0449 12/24/11 0450   NA 138 131*   K 4.4 4.3   CL 104 98   CO2 28 24   BUN 13 12   CREATININE 0.99 0.99   CALCIUM 8.7 7.9*   GFRNONAA 83* 83*   GFRAA >90 >90     No results found for this basename: PT:2,INR:2,APTT:2 in the last 72 hours   No components found with this basename: ABG:2    Length of stay: 2 days.  Assessment: Prostate cancer. Acute blood loss anemia. Post-op ileus. POD#2 Robotic radical prostatectomy with bilateral pelvic lymph node dissection.   Plan: -Anemia: Stable. Discontinue  H&H. -Ileus appears to be resolving quickly.  -Discontinue JP drain. -Saline lock IV. -Possible discharge home today.   Natalia Leatherwood, MD [phone]

## 2011-12-25 NOTE — Discharge Summary (Signed)
Physician Discharge Summary  Patient ID: Andre Arias MRN: [id number] DOB/AGE: 06-23-1945 66 y.o.  Admit date: 12/23/2011 Discharge date: 12/25/2011  Admission Diagnoses: Prostate cancer  Discharge Diagnoses:  Prostate cancer Acute blood loss anemia Post operative ileus  Discharged Condition: good  Hospital Course:  This patient was admitted postoperatively for observation following a robotic-assisted laparoscopic radical prostatectomy with bilateral pelvic lymph node dissection and a non-nerve sparing approach. He had an extended surgical time due to invasion of his prostate into the bladder which required extensive bladder neck tailoring. Following surgery he was on bed rest and mild catheter traction to prevent urinary leakage from his urethral anastomosis. He did experience acute blood loss anemia from the surgery. This remained stable on serial hemoglobin checks. He developed postoperative ileus with abdominal distention. On postoperative day one, his JP drain had a high volume output and this was sent for fluid creatinine. This returned consistent with serum indicating no urinary leakage. He was started on heparin DVT prophylaxis on the evening of postoperative day one; he had received preoperative heparin DVT prophylaxis. He was wearing TEDs hose and SCDs during his entire hospitalization. He ambulated which helped significantly with his ileus. He was converted to inpatient hospitalization on postoperative day one due to his abdominal distention and ileus. On postoperative day 2, he began passing flatus and having bowel movements. He was able to advance his diet to regular and tolerated this. His pain was controlled with pain medications and he was able to ambulate. His JP drain was removed and it was felt that he could be discharged home. He was thought Foley catheter care by his nursing staff.  Consults: None  Significant Diagnostic Studies: labs: JP fluid creatinine  1.8.  Treatments: surgery: Robotic radical prostatectomy with bilateral pelvic lymph node dissection.  Discharge Exam: Blood pressure 170/87, pulse 94, temperature 98.8 F (37.1 C), temperature source Oral, resp. rate 16, height 5\' 9"  (1.753 m), weight 91.808 kg (202 lb 6.4 oz), SpO2 97.00%. See PE from progress note on day of discharge.  Disposition: Home- self care.  Discharge Orders    Future Orders Please Complete By Expires   Discharge patient          Medication List     As of 12/25/2011 11:59 AM    STOP taking these medications         multivitamin with minerals Tabs      Tamsulosin HCl 0.4 MG Caps   Commonly known as: FLOMAX      TAKE these medications         bacitracin-neomycin-polymyxin-hydrocortisone 1 % ointment   Commonly known as: CORTISPORIN   Apply topically 3 (three) times daily. Apply to tip of penis.      bisacodyl 10 MG suppository   Commonly known as: DULCOLAX   Place 1 suppository (10 mg total) rectally 2 (two) times daily.      ciprofloxacin 500 MG tablet   Commonly known as: CIPRO   Take 0.5 tablets (250 mg total) by mouth 2 (two) times daily.      hyoscyamine 0.125 MG tablet   Commonly known as: LEVSIN, ANASPAZ   Take 1 tablet (0.125 mg total) by mouth every 4 (four) hours as needed for cramping (bladder spasms).      lisinopril-hydrochlorothiazide 10-12.5 MG per tablet   Commonly known as: PRINZIDE,ZESTORETIC   Take 1 tablet by mouth every morning.      oxybutynin 5 MG tablet   Commonly known as: DITROPAN  Take 1 tablet (5 mg total) by mouth every 6 (six) hours as needed (bladder spasms).      oxyCODONE-acetaminophen 5-325 MG per tablet   Commonly known as: PERCOCET/ROXICET   Take 1-2 tablets by mouth every 4 (four) hours as needed for pain.      senna-docusate 8.6-50 MG per tablet   Commonly known as: Senokot-S   Take 1 tablet by mouth 2 (two) times daily.           Follow-up Information    Follow up with Milford Cage, MD. On 01/05/2012. (1:30 pm for cystogram; 2:00 for office visit.)    Contact information:   [address] [place] [address] [phone]          Signed: Milford Cage 12/25/2011, 11:59 AM

## 2011-12-25 NOTE — Progress Notes (Signed)
Pt had slight burning sensation on right upper arm. Noted approximate quarter sized skin tear. Patient states "I must have gotten that in surgery". Tegaderm applied. Ginny Forth

## 2013-10-03 IMAGING — CR DG CHEST 2V
1 series · 2 of 2 positions shown · non-contrast
Comparison: none

REASON FOR EXAM: cough and congestion for 3 weeks
COMMENTS:

PROCEDURE:     MDR - MDR CHEST PA(OR AP) AND LATERAL  - April 12, 2011  [DATE]
RESULT:     Comparison: None.

[Series 1: pa · 0.17mm/px · 2 of 2 slices shown]
[im 1/2]
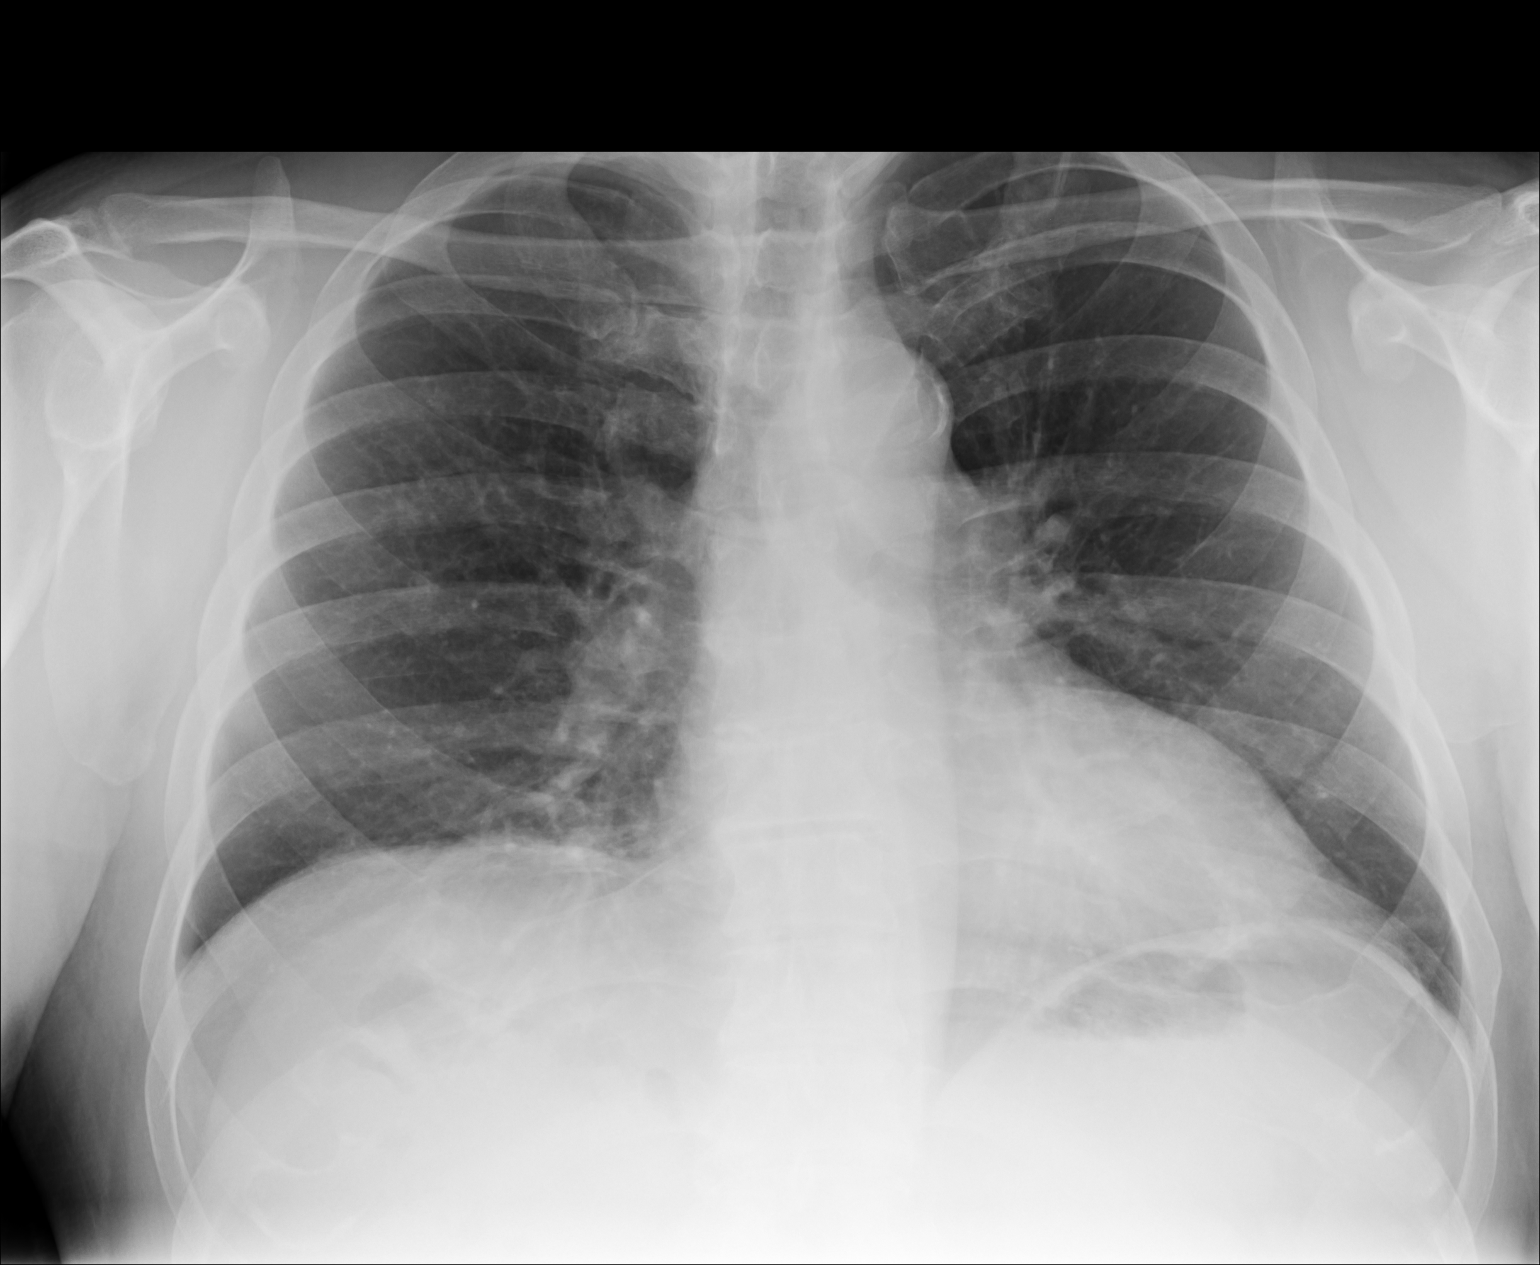
[im 2/2]
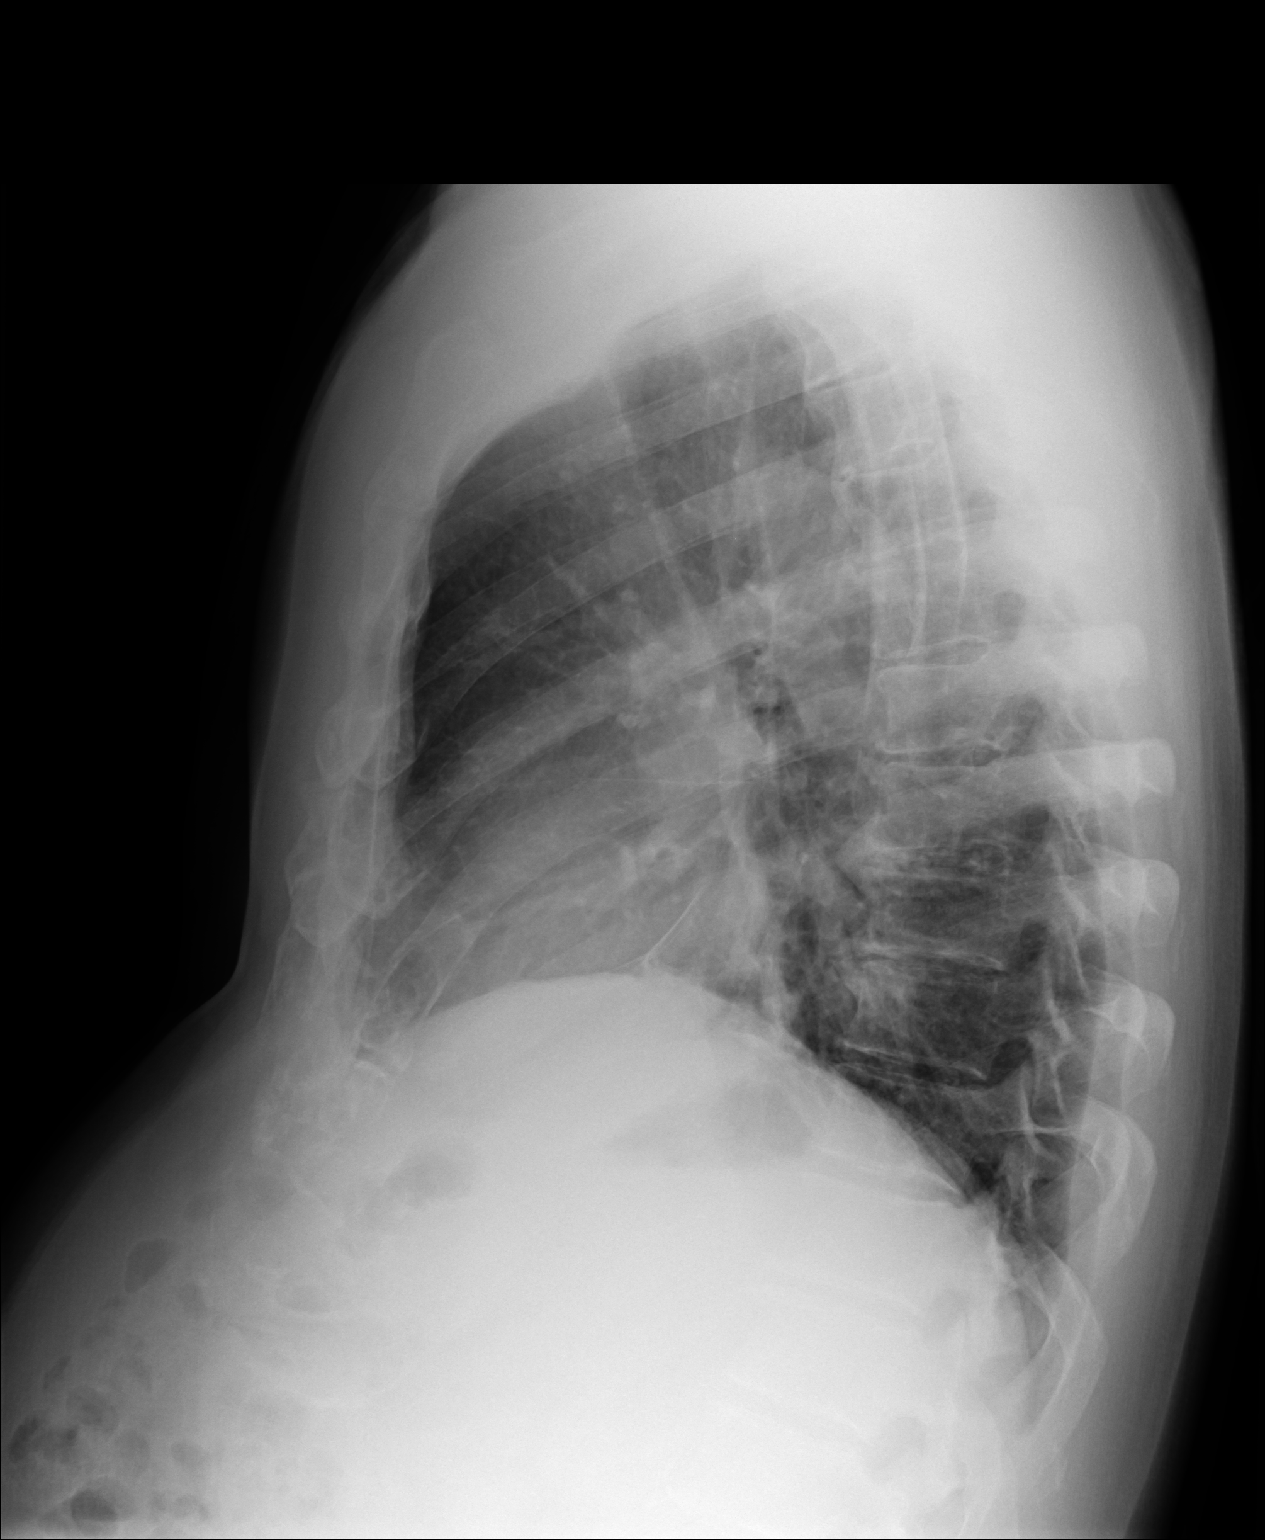

[2 of 2 positions shown; findings below may reference images not displayed]

FINDINGS: Heart is upper limits of normal. The lung volumes are low. There mild
basilar reticular opacities which may be secondary to atelectasis. There is
prominence of the bilateral hila which is likely related to vascular
crowding from the low lung volumes.
IMPRESSION: 1. Mild basilar opacities may be secondary to atelectasis from the low lung
volumes. However, other etiologies such as infection are not excluded.
Followup PA and lateral chest radiographs are recommended.
2. Prominence of the hila may be secondary to vascular crowding from the low
lung volumes. However, lymphadenopathy cannot be excluded. Recommend
attention on the followup chest radiograph.

## 2014-06-08 IMAGING — CR DG CHEST 2V
2 series · 2 of 2 positions shown · non-contrast
Comparison: None.

CLINICAL DATA: Preop

CHEST - 2 VIEW

[w chest pa]
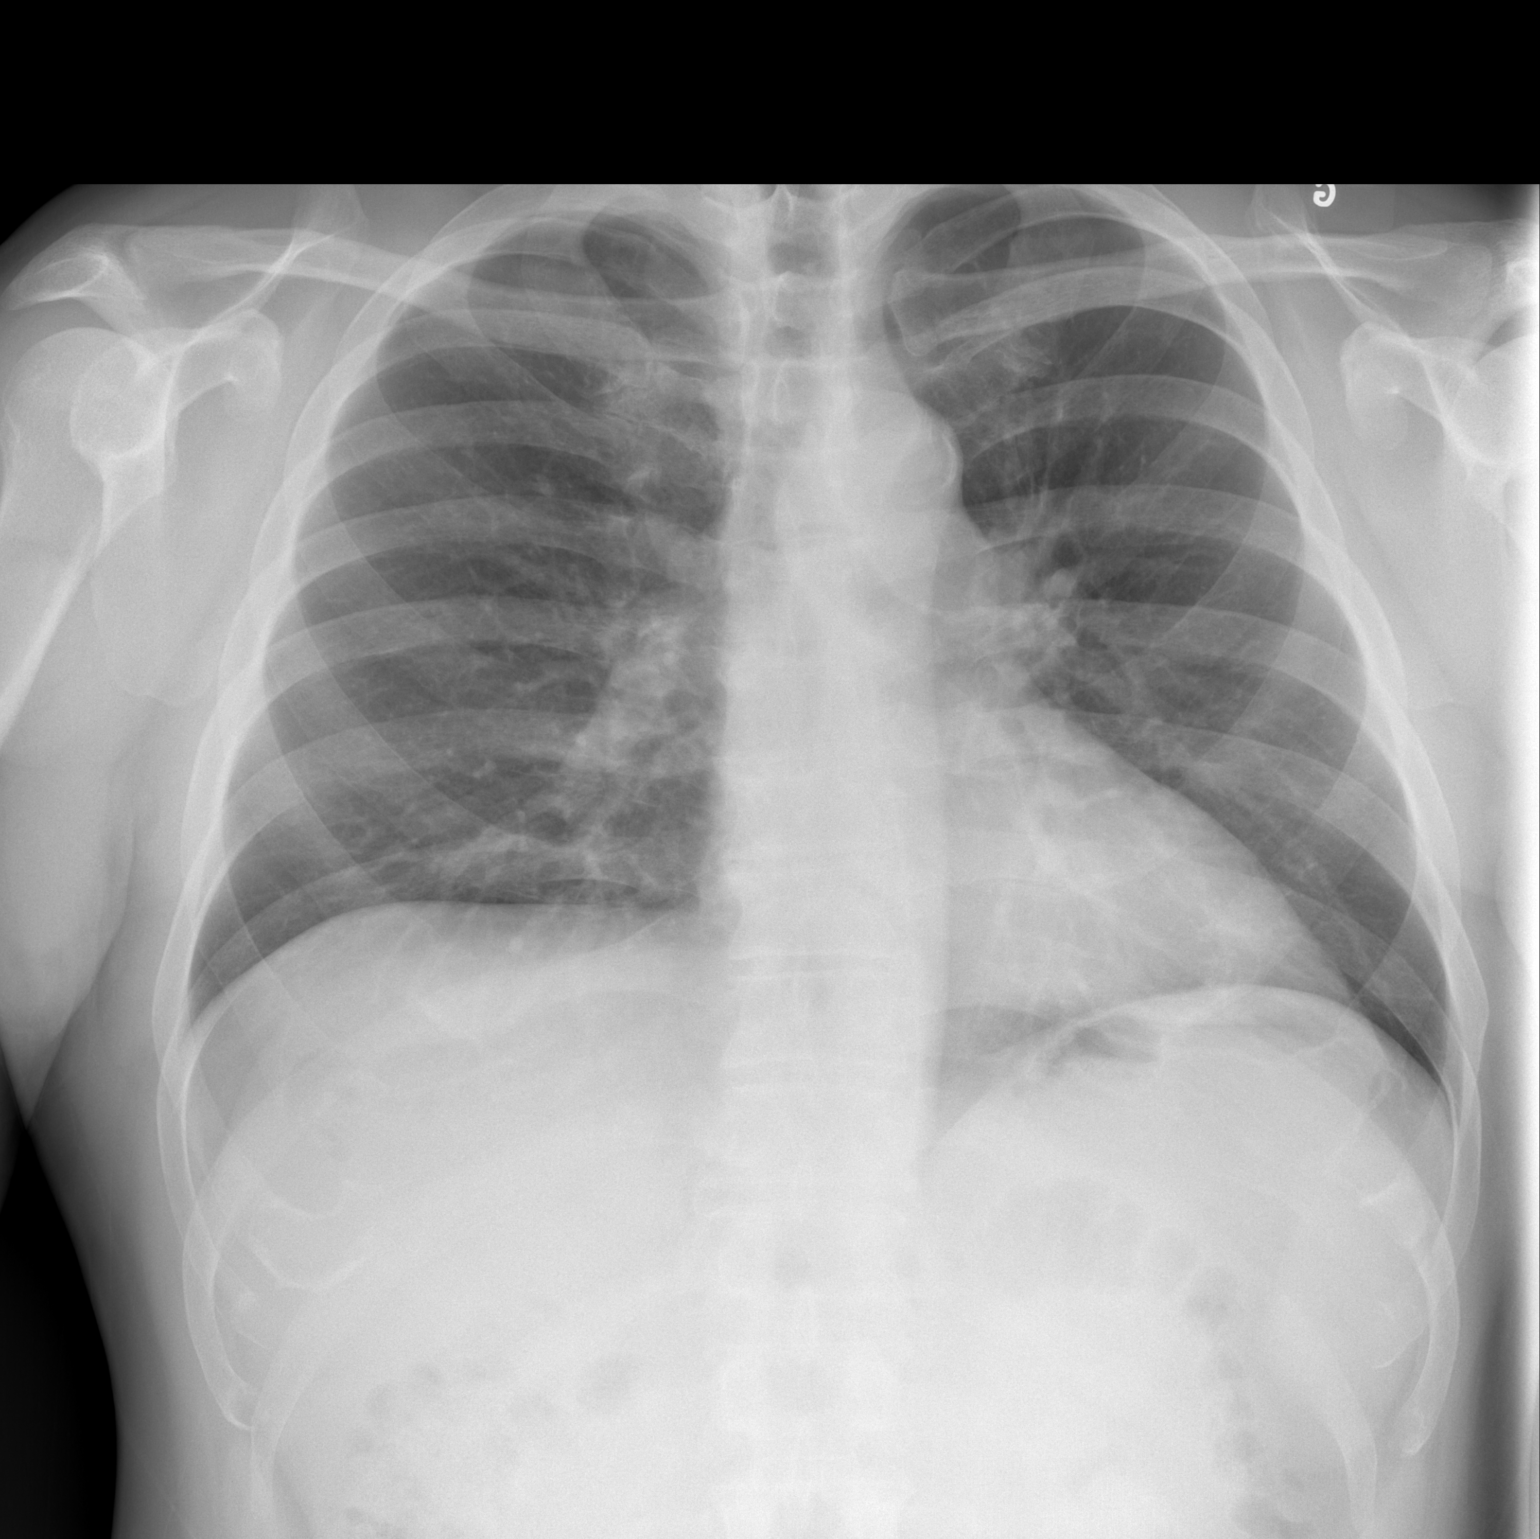

[w chest lat]
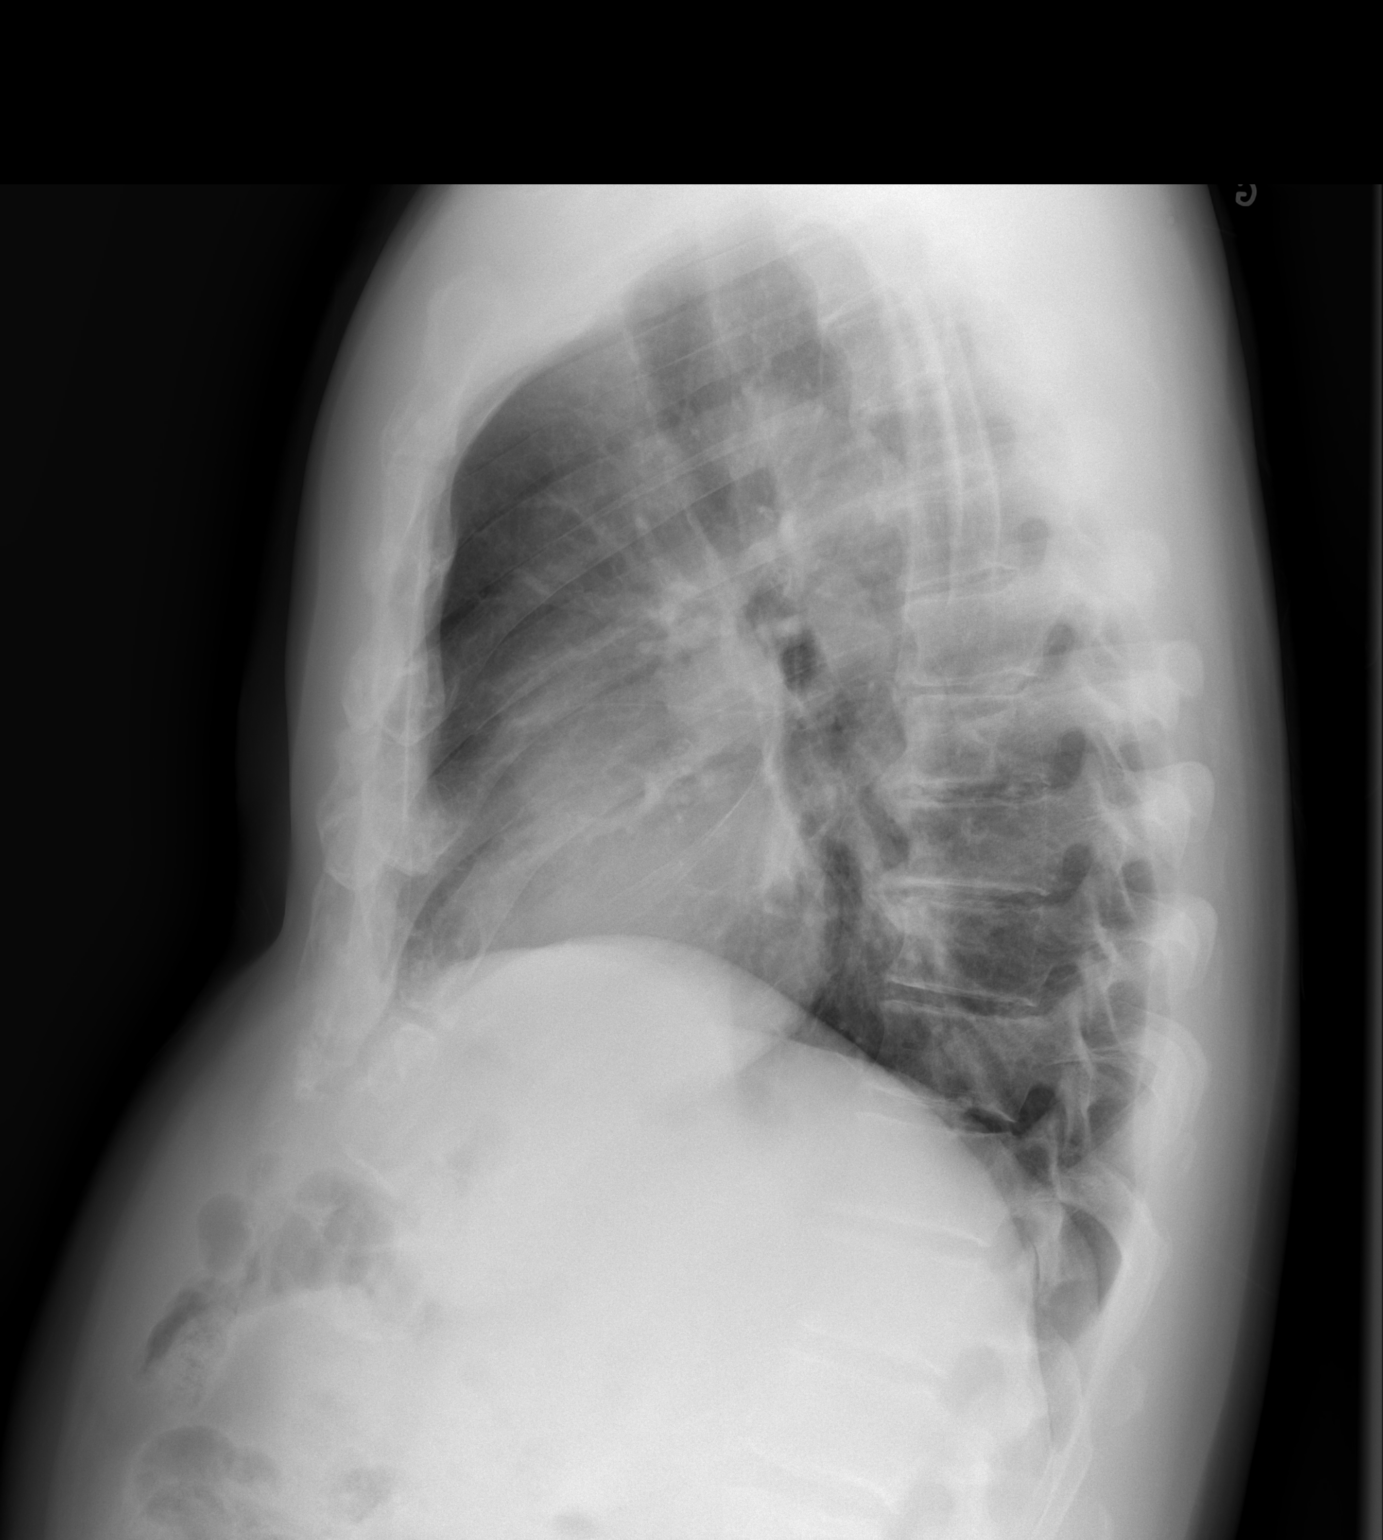

[2 of 2 positions shown; findings below may reference images not displayed]

FINDINGS: Cardiomediastinal silhouette is unremarkable.  No acute
infiltrate or pleural effusion.  No pulmonary edema.  Mild
degenerative changes thoracic spine.
IMPRESSION: No active disease.  Mild degenerative changes thoracic spine.

## 2017-04-01 DIAGNOSIS — E7800 Pure hypercholesterolemia, unspecified: Secondary | ICD-10-CM | POA: Diagnosis not present

## 2017-04-01 DIAGNOSIS — Z0000 Encounter for general adult medical examination without abnormal findings: Secondary | ICD-10-CM | POA: Diagnosis not present

## 2017-04-01 DIAGNOSIS — I10 Essential (primary) hypertension: Secondary | ICD-10-CM | POA: Diagnosis not present

## 2017-04-08 DIAGNOSIS — E669 Obesity, unspecified: Secondary | ICD-10-CM | POA: Diagnosis not present

## 2017-04-08 DIAGNOSIS — Z0000 Encounter for general adult medical examination without abnormal findings: Secondary | ICD-10-CM | POA: Diagnosis not present

## 2017-04-08 DIAGNOSIS — I10 Essential (primary) hypertension: Secondary | ICD-10-CM | POA: Diagnosis not present

## 2017-04-08 DIAGNOSIS — E7800 Pure hypercholesterolemia, unspecified: Secondary | ICD-10-CM | POA: Diagnosis not present

## 2017-04-08 DIAGNOSIS — E871 Hypo-osmolality and hyponatremia: Secondary | ICD-10-CM | POA: Diagnosis not present

## 2017-04-08 DIAGNOSIS — R7303 Prediabetes: Secondary | ICD-10-CM | POA: Diagnosis not present

## 2017-04-08 DIAGNOSIS — Z862 Personal history of diseases of the blood and blood-forming organs and certain disorders involving the immune mechanism: Secondary | ICD-10-CM | POA: Diagnosis not present

## 2017-07-08 DIAGNOSIS — E7800 Pure hypercholesterolemia, unspecified: Secondary | ICD-10-CM | POA: Diagnosis not present

## 2017-07-08 DIAGNOSIS — I10 Essential (primary) hypertension: Secondary | ICD-10-CM | POA: Diagnosis not present

## 2017-07-08 DIAGNOSIS — R7303 Prediabetes: Secondary | ICD-10-CM | POA: Diagnosis not present

## 2017-07-15 DIAGNOSIS — E7800 Pure hypercholesterolemia, unspecified: Secondary | ICD-10-CM | POA: Diagnosis not present

## 2017-07-15 DIAGNOSIS — Z0000 Encounter for general adult medical examination without abnormal findings: Secondary | ICD-10-CM | POA: Diagnosis not present

## 2017-07-15 DIAGNOSIS — I10 Essential (primary) hypertension: Secondary | ICD-10-CM | POA: Diagnosis not present

## 2017-07-15 DIAGNOSIS — R7303 Prediabetes: Secondary | ICD-10-CM | POA: Diagnosis not present

## 2018-01-11 DIAGNOSIS — I10 Essential (primary) hypertension: Secondary | ICD-10-CM | POA: Diagnosis not present

## 2018-01-11 DIAGNOSIS — R7303 Prediabetes: Secondary | ICD-10-CM | POA: Diagnosis not present

## 2018-01-11 DIAGNOSIS — E7800 Pure hypercholesterolemia, unspecified: Secondary | ICD-10-CM | POA: Diagnosis not present

## 2018-01-18 DIAGNOSIS — E7800 Pure hypercholesterolemia, unspecified: Secondary | ICD-10-CM | POA: Diagnosis not present

## 2018-01-18 DIAGNOSIS — R7303 Prediabetes: Secondary | ICD-10-CM | POA: Diagnosis not present

## 2018-01-18 DIAGNOSIS — Z862 Personal history of diseases of the blood and blood-forming organs and certain disorders involving the immune mechanism: Secondary | ICD-10-CM | POA: Diagnosis not present

## 2018-01-18 DIAGNOSIS — I10 Essential (primary) hypertension: Secondary | ICD-10-CM | POA: Diagnosis not present

## 2018-01-18 DIAGNOSIS — Z23 Encounter for immunization: Secondary | ICD-10-CM | POA: Diagnosis not present

## 2018-02-16 HISTORY — PX: COLONOSCOPY: SHX5424

## 2018-05-17 DIAGNOSIS — I10 Essential (primary) hypertension: Secondary | ICD-10-CM | POA: Diagnosis not present

## 2018-05-17 DIAGNOSIS — E7800 Pure hypercholesterolemia, unspecified: Secondary | ICD-10-CM | POA: Diagnosis not present

## 2018-05-17 DIAGNOSIS — R7303 Prediabetes: Secondary | ICD-10-CM | POA: Diagnosis not present

## 2018-05-24 DIAGNOSIS — H9191 Unspecified hearing loss, right ear: Secondary | ICD-10-CM | POA: Diagnosis not present

## 2018-05-24 DIAGNOSIS — E7800 Pure hypercholesterolemia, unspecified: Secondary | ICD-10-CM | POA: Diagnosis not present

## 2018-05-24 DIAGNOSIS — R7303 Prediabetes: Secondary | ICD-10-CM | POA: Diagnosis not present

## 2018-05-24 DIAGNOSIS — I10 Essential (primary) hypertension: Secondary | ICD-10-CM | POA: Diagnosis not present

## 2018-07-15 DIAGNOSIS — R7303 Prediabetes: Secondary | ICD-10-CM | POA: Diagnosis not present

## 2018-07-15 DIAGNOSIS — I10 Essential (primary) hypertension: Secondary | ICD-10-CM | POA: Diagnosis not present

## 2018-07-15 DIAGNOSIS — R0789 Other chest pain: Secondary | ICD-10-CM | POA: Diagnosis not present

## 2018-07-15 DIAGNOSIS — R0602 Shortness of breath: Secondary | ICD-10-CM | POA: Diagnosis not present

## 2018-07-15 DIAGNOSIS — E7800 Pure hypercholesterolemia, unspecified: Secondary | ICD-10-CM | POA: Diagnosis not present

## 2018-07-22 DIAGNOSIS — H9319 Tinnitus, unspecified ear: Secondary | ICD-10-CM | POA: Diagnosis not present

## 2018-07-22 DIAGNOSIS — I519 Heart disease, unspecified: Secondary | ICD-10-CM | POA: Diagnosis not present

## 2018-07-22 DIAGNOSIS — R42 Dizziness and giddiness: Secondary | ICD-10-CM | POA: Diagnosis not present

## 2018-07-22 DIAGNOSIS — I10 Essential (primary) hypertension: Secondary | ICD-10-CM | POA: Diagnosis not present

## 2018-07-22 DIAGNOSIS — H903 Sensorineural hearing loss, bilateral: Secondary | ICD-10-CM | POA: Diagnosis not present

## 2018-07-26 DIAGNOSIS — R0602 Shortness of breath: Secondary | ICD-10-CM | POA: Diagnosis not present

## 2018-08-02 DIAGNOSIS — I10 Essential (primary) hypertension: Secondary | ICD-10-CM | POA: Diagnosis not present

## 2018-08-02 DIAGNOSIS — E7800 Pure hypercholesterolemia, unspecified: Secondary | ICD-10-CM | POA: Diagnosis not present

## 2018-08-03 DIAGNOSIS — M542 Cervicalgia: Secondary | ICD-10-CM | POA: Diagnosis not present

## 2018-08-03 DIAGNOSIS — M79601 Pain in right arm: Secondary | ICD-10-CM | POA: Diagnosis not present

## 2018-08-03 DIAGNOSIS — M79641 Pain in right hand: Secondary | ICD-10-CM | POA: Diagnosis not present

## 2018-08-03 DIAGNOSIS — M19042 Primary osteoarthritis, left hand: Secondary | ICD-10-CM | POA: Diagnosis not present

## 2018-08-03 DIAGNOSIS — M79642 Pain in left hand: Secondary | ICD-10-CM | POA: Diagnosis not present

## 2018-08-03 DIAGNOSIS — M79602 Pain in left arm: Secondary | ICD-10-CM | POA: Diagnosis not present

## 2018-08-04 DIAGNOSIS — R42 Dizziness and giddiness: Secondary | ICD-10-CM | POA: Diagnosis not present

## 2018-08-09 DIAGNOSIS — H903 Sensorineural hearing loss, bilateral: Secondary | ICD-10-CM | POA: Diagnosis not present

## 2018-08-09 DIAGNOSIS — R42 Dizziness and giddiness: Secondary | ICD-10-CM | POA: Diagnosis not present

## 2018-08-23 DIAGNOSIS — Z862 Personal history of diseases of the blood and blood-forming organs and certain disorders involving the immune mechanism: Secondary | ICD-10-CM | POA: Diagnosis not present

## 2018-08-23 DIAGNOSIS — K5909 Other constipation: Secondary | ICD-10-CM | POA: Diagnosis not present

## 2018-08-23 DIAGNOSIS — Z8546 Personal history of malignant neoplasm of prostate: Secondary | ICD-10-CM | POA: Diagnosis not present

## 2018-08-23 DIAGNOSIS — Z86010 Personal history of colonic polyps: Secondary | ICD-10-CM | POA: Diagnosis not present

## 2018-08-23 DIAGNOSIS — Z01812 Encounter for preprocedural laboratory examination: Secondary | ICD-10-CM | POA: Diagnosis not present

## 2018-08-26 ENCOUNTER — Encounter: Payer: Self-pay | Admitting: Physical Therapy

## 2018-08-26 ENCOUNTER — Other Ambulatory Visit: Payer: Self-pay

## 2018-08-26 ENCOUNTER — Ambulatory Visit: Payer: Medicare HMO | Attending: Physician Assistant | Admitting: Physical Therapy

## 2018-08-26 DIAGNOSIS — R42 Dizziness and giddiness: Secondary | ICD-10-CM | POA: Insufficient documentation

## 2018-08-26 NOTE — Therapy (Addendum)
[facility] [address] [place], [place], [address] Phone: [phone]   Fax:  [phone]  Physical Therapy Evaluation  Patient Details  Name: Andre Arias MRN: [id number] Date of Birth: 05-23-1945 Referring Provider (PT): Brett Fairy, [place]   Encounter Date: 08/26/2018  PT End of Session - 08/26/18 1018    Visit Number  1    Number of Visits  1    PT Start Time  8264    PT Stop Time  1000    PT Time Calculation (min)  63 min    Equipment Utilized During Treatment  Gait belt    Activity Tolerance  Patient tolerated treatment well    Behavior During Therapy  [facility] for tasks assessed/performed       Past Medical History:  Diagnosis Date  . Cancer [facility])    prostate  . Hypertension     Past Surgical History:  Procedure Laterality Date  . LYMPHADENECTOMY  12/23/2011   Procedure: LYMPHADENECTOMY;  Surgeon: Molli Hazard, MD;  Location: [facility] ORS;  Service: Urology;  Laterality: Bilateral;  . ROBOT ASSISTED LAPAROSCOPIC RADICAL PROSTATECTOMY  12/23/2011   Procedure: ROBOTIC ASSISTED LAPAROSCOPIC RADICAL PROSTATECTOMY LEVEL 2;  Surgeon: Molli Hazard, MD;  Location: [facility];  Service: Urology;  Laterality: N/A;      . TONSILLECTOMY     as child    There were no vitals filed for this visit.   Subjective Assessment - 08/26/18 0900    Subjective  Patient reports his dizziness has been gone for about a month or more. Patient reports no dizziness this date.    Pertinent History  Patient reports he began to get intense vertigo in March 2020. Patient reports that the first time it happened he was reading and looked up from the book and got sudden onset of dizziness. Patient described his dizziness as vertigo, unsteadiness, lightheadedness and whooziness. Patient reports he was housebound for a month due to his symptoms. Patient reports he has not had any injury. Patient stated at times he was experiencing nausea.  Patient states he was unsteady and fearful of falling. He was not able to drive at that time and stopped going to his gym due to dizziness.  Patient reports that initially he was not able to be seen in the clinic to due [place]. Patient reports his dizziness symptoms continued for a month and half. Patient reports he was seen at by the ENT and had VNG testing performed. 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 Patient states he was referred to Dr. Melrose Nakayama, neurology. Patient reports he lost hearing in March as well and will be needing hearing aides. 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 Patient reports that he has not had any dizziness in over a month. Patient states that he has been able to resume all of his normal activities except going to the gym, which remains closed due to the [place] epidemic. Patient was doing the elliptical machine at level 10 for 45 minutes and using the weight machines when he was able to go to the gym. Patient reports that he has been walking several miles each day in the morning to try to stay active. Patient reports yesterday he was able to climb on a ladder without symptoms or difficulty.    Diagnostic tests  VNG-per MR revealed abnormal smooth pursuits, saccades and optokinetic testing with 30% right caloric weakness    Patient Stated Goals  to be able to return to the gym    Currently in Pain?  No/denies         [facility] PT Assessment - 08/26/18 1015      Assessment   Medical Diagnosis  dizziness and giddiness    Referring Provider (PT)  Brett Fairy, PA-C    Prior Therapy  none      Precautions   Precautions  None      Restrictions   Weight Bearing Restrictions  No      Balance Screen   Has the patient fallen in the past 6 months  No    Has the patient had a decrease in activity level because of a  fear of falling?   No   did initially but not currently   Is the patient reluctant to leave their home because of a fear of falling?   No      Home Film/video editor residence    Living Arrangements  Spouse/significant other    Available Help at Discharge  Family;Friend(s)    Type of Home  House      Prior Function   Level of [place] with community mobility without device    Leisure  goes to the gym 3 times a week, active in church, daily walks      Cognition   Overall Cognitive Status  Within Functional Limits for tasks assessed      Standardized Balance Assessment   Standardized Balance Assessment  Dynamic Gait Index      Dynamic Gait Index   Level Surface  Normal    Change in Gait Speed  Normal    Gait with Horizontal Head Turns  Normal    Gait with Vertical Head Turns  Normal    Gait and Pivot Turn  Normal    Step Over Obstacle  Normal    Step Around Obstacles  Normal    Steps  Normal    Total Score  24         VESTIBULAR AND BALANCE EVALUATION   HISTORY:  Subjective history of current problem: Patient reports he began to get intense vertigo in March 2020. Patient reports that the first time it happened he was reading and looked up from the book and got sudden onset of dizziness. Patient described his dizziness as vertigo, unsteadiness, lightheadedness and whooziness. Patient reports he was housebound for a month due to his symptoms. Patient reports he has not had any injury. Patient stated at times he was experiencing nausea. Patient states he was unsteady and fearful of falling. He was not able to drive at that time and stopped going to his gym due to dizziness.  Patient reports that initially he was not able to be seen in the clinic to due [place]. Patient reports his dizziness symptoms continued for a month and half. Patient reports he was seen at by the ENT and had VNG testing performed. Patient reports he had VNG that showed  right inner ear was concussed and reported abnormal smooth pursuits. Patient states he was referred to Dr. Melrose Nakayama, neurology. Patient reports he lost hearing in March as well and will be needing hearing aides. 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 Patient reports that he  has not had any dizziness in over a month. Patient states that he has been able to resume all of his normal activities except going to the gym, which remains closed due to the [place] epidemic. Patient was doing the elliptical machine at level 10 for 45 minutes and using the weight machines when he was able to go to the gym. Patient reports that he has been walking several miles each day in the morning to try to stay active. Patient reports yesterday he was able to climb on a ladder without symptoms or difficulty.   Patient reports that he is on three medications for high blood pressure but he is unable to recall the names of the medications and the dosages.  Description of dizziness: vertigo, unsteadiness, lightheadedness, whooziness  Progression of symptoms: better History of similar episodes: none  Falls (yes/no): no Number of falls in past 6 months: 0 Prior Functional Level: patient was community ambulator, driving and was going to the gym three times a week  Auditory complaints (tinnitus, pain, drainage): tinnitus intermittently in right ear that began concurrently with patient's symptoms of dizziness in March 2020. Vision (last eye exam, diplopia, recent changes): denies vision changes; patient reports he does need new glass prescription. Patient wears glasses.  Current Symptoms: (dysarthria, dysphagia, drop attacks, bowel and bladder changes, recent weight loss/gain)   Review of systems negative for red flags.    EXAMINATION  POSTURE:  Noted mild forward head posture  SOMATOSENSORY:  Any N & T in extremities or weakness:  denies      COORDINATION: Finger to Nose:  Dysmetric   Past Pointing:    Right-mild pass pointing  MUSCULOSKELETAL SCREEN: Cervical Spine ROM: Cervical AROM right and left rotation [facility] but limited to about 0-50 degrees limited by discomfort "pulling" sensation at end range and WNL flexion and extension.  Gait: Patient arrives ambulating without AD. Patient ambulates with good cadence with reciprocal arm swing. Patient able to vary gait speed with out veering or difficulty.  Scanning of visual environment with gait is: good  Balance: Patient is challenged by uneven surfaces, narrow BOS and eyes closed activities.   POSTURAL CONTROL TESTS:  Clinical Test of Sensory Interaction for Balance    (CTSIB):  CONDITION TIME STRATEGY SWAY  Eyes open, firm surface 30 seconds ankle   Eyes closed, firm surface 30 seconds ankle +1  Eyes open, foam surface 30 seconds ankle +1  Eyes closed, foam surface 30 seconds ankle +2    OCULOMOTOR / VESTIBULAR TESTING:  Oculomotor Exam- Room Light  Normal Abnormal Comments  Ocular Alignment N    Ocular ROM N    Spontaneous Nystagmus N    Gaze evoked Nystagmus N    Smooth Pursuit N    Saccades  Abn Very mild hypometric saccades all fields  VOR N    VOR Cancellation N    Left Head Impulse N    Right Head Impulse N     BPPV TESTS:  Symptoms Duration Intensity Nystagmus  Left Dix-Hallpike None   None observed  Right Dix-Hallpike None   None observed    FUNCTIONAL OUTCOME MEASURES:  Results Comments  DHI     36/100 low perception of handicap  ABC Scale     97.5 % normal  DGI      24 /24 normal    Therapeutic Exercise: VOR X 1 exercise:  Demonstrated and educated as to VOR X1.  Patient performed VOR X 1 horizontal in standing 3 reps of  1 minute each with conflicting background demonstrating proper technique.  Patient reports the target is staying in focus and reports 0/10 dizziness.  Issued VOR X 1 for HEP for if patient's symptoms of  dizziness return.   Ambulation with head turns:  Patient performed 175' trials of forwards and retro ambulation with horizontal, vertical head and random turns with no veering or uneven steppage noted. Patient demonstrated good speed with both forwards and retro ambulation.    Patient reports no dizziness.  Body Wall Rolls:  Patient performed 4 reps of supported, body wall rolls with eyes closed with S.  Patient reports 0/10 dizziness with this activity.    PT Education - 08/26/18 1017    Education Details  discussed plan of care and discharge; patient in agreement; discussed VOR X 1 exercise and gave business card should his symptoms return.    Person(s) Educated  Patient    Methods  Explanation;Demonstration;Handout;Verbal cues    Comprehension  Verbalized understanding;Returned demonstration       PT Short Term Goals - 08/26/18 1019      PT SHORT TERM GOAL #1   Title  Patient will be able to perform VOR X 1 exercise demonstrating proper technique to be performed should patient's symptoms of dizziness return for self-management.    Time  1    Period  Days    Status  Achieved                Plan - 08/26/18 1020    Clinical Impression Statement  Patient presents to the clinic reporting that he has had full resolution of his dizziness symptoms for a month or more. Patient reports that he has been able to resume all of his normal activites except for going to the gym three times a week due to the gym being closed. Patient states that he feels that he would be able to return to all of his prior gym activities without difficulties with dizziness. Patient with negative canal testing bilaterally this date. Patient noted to have hypometric saccades with vestibular testing. Unable to elicit any symptoms of dizziness or imbalance with testing in the clinic this date. Patient scored 24/24 on the DGI, low perception of handicap 36/100 on the [facility] and scored 97.5% confidence on the ABC  scale. Patient appears to be at his baseline and is reporting resolution of his dizziness and imbalance symptoms at this time. Patient educated as to VOR X 1 exercise to be performed if he has a reoccurence of his dizzines symptoms and provided with information to contact the rehab clinic if his symptoms return and he is in need of vestibular therapy. Patient in agreement with discharge plan at this time.    Personal Factors and Comorbidities  Comorbidity 1    Comorbidities  HTN    Stability/Clinical Decision Making  Stable/Uncomplicated    Clinical Decision Making  Low    Rehab Potential  Excellent    PT Frequency  Other (comment)   eval only   PT Duration  Other (comment)   eval only   PT Treatment/Interventions  Therapeutic exercise;Vestibular    Consulted and Agree with Plan of Care  Patient       Patient will benefit from skilled therapeutic intervention in order to improve the following deficits and impairments:  Decreased balance, Decreased coordination  Visit Diagnosis: 1. Dizziness and giddiness        Problem List There are no active problems to display for this patient.  Brendan Gruwell  Crist Fat, DPT [phone] Lady Deutscher 08/26/2018, 10:52 AM  [facility] [address] [place], [place], [address] Phone: [phone]   Fax:  [phone]  Name: ANIELLO CHRISTOPOULOS MRN: [id number] Date of Birth: 08/07/1945

## 2018-09-02 ENCOUNTER — Ambulatory Visit: Payer: Medicare HMO | Admitting: Physical Therapy

## 2018-09-05 DIAGNOSIS — D649 Anemia, unspecified: Secondary | ICD-10-CM | POA: Diagnosis not present

## 2018-09-13 DIAGNOSIS — Z01812 Encounter for preprocedural laboratory examination: Secondary | ICD-10-CM | POA: Diagnosis not present

## 2018-09-20 DIAGNOSIS — Z86010 Personal history of colonic polyps: Secondary | ICD-10-CM | POA: Diagnosis not present

## 2018-09-20 DIAGNOSIS — Z1211 Encounter for screening for malignant neoplasm of colon: Secondary | ICD-10-CM | POA: Diagnosis not present

## 2018-09-20 DIAGNOSIS — K640 First degree hemorrhoids: Secondary | ICD-10-CM | POA: Diagnosis not present

## 2018-09-20 DIAGNOSIS — K635 Polyp of colon: Secondary | ICD-10-CM | POA: Diagnosis not present

## 2018-10-03 DIAGNOSIS — I10 Essential (primary) hypertension: Secondary | ICD-10-CM | POA: Diagnosis not present

## 2018-10-03 DIAGNOSIS — R42 Dizziness and giddiness: Secondary | ICD-10-CM | POA: Diagnosis not present

## 2018-10-18 DIAGNOSIS — R7303 Prediabetes: Secondary | ICD-10-CM | POA: Diagnosis not present

## 2018-10-18 DIAGNOSIS — Z862 Personal history of diseases of the blood and blood-forming organs and certain disorders involving the immune mechanism: Secondary | ICD-10-CM | POA: Diagnosis not present

## 2018-10-18 DIAGNOSIS — E7800 Pure hypercholesterolemia, unspecified: Secondary | ICD-10-CM | POA: Diagnosis not present

## 2018-10-18 DIAGNOSIS — I10 Essential (primary) hypertension: Secondary | ICD-10-CM | POA: Diagnosis not present

## 2018-10-18 DIAGNOSIS — R7989 Other specified abnormal findings of blood chemistry: Secondary | ICD-10-CM | POA: Diagnosis not present

## 2018-10-25 DIAGNOSIS — I10 Essential (primary) hypertension: Secondary | ICD-10-CM | POA: Diagnosis not present

## 2018-10-25 DIAGNOSIS — M50123 Cervical disc disorder at C6-C7 level with radiculopathy: Secondary | ICD-10-CM | POA: Diagnosis not present

## 2018-10-25 DIAGNOSIS — M5412 Radiculopathy, cervical region: Secondary | ICD-10-CM | POA: Diagnosis not present

## 2018-10-25 DIAGNOSIS — E785 Hyperlipidemia, unspecified: Secondary | ICD-10-CM | POA: Diagnosis not present

## 2018-10-25 DIAGNOSIS — Z0000 Encounter for general adult medical examination without abnormal findings: Secondary | ICD-10-CM | POA: Diagnosis not present

## 2018-11-02 DIAGNOSIS — M5412 Radiculopathy, cervical region: Secondary | ICD-10-CM | POA: Diagnosis not present

## 2018-11-02 DIAGNOSIS — M542 Cervicalgia: Secondary | ICD-10-CM | POA: Diagnosis not present

## 2018-11-02 DIAGNOSIS — M6281 Muscle weakness (generalized): Secondary | ICD-10-CM | POA: Diagnosis not present

## 2018-11-09 DIAGNOSIS — M6281 Muscle weakness (generalized): Secondary | ICD-10-CM | POA: Diagnosis not present

## 2018-11-09 DIAGNOSIS — M5412 Radiculopathy, cervical region: Secondary | ICD-10-CM | POA: Diagnosis not present

## 2018-11-09 DIAGNOSIS — M542 Cervicalgia: Secondary | ICD-10-CM | POA: Diagnosis not present

## 2018-11-11 DIAGNOSIS — M542 Cervicalgia: Secondary | ICD-10-CM | POA: Diagnosis not present

## 2018-11-11 DIAGNOSIS — M5412 Radiculopathy, cervical region: Secondary | ICD-10-CM | POA: Diagnosis not present

## 2019-04-18 DIAGNOSIS — I10 Essential (primary) hypertension: Secondary | ICD-10-CM | POA: Diagnosis not present

## 2019-04-18 DIAGNOSIS — E7800 Pure hypercholesterolemia, unspecified: Secondary | ICD-10-CM | POA: Diagnosis not present

## 2019-04-25 DIAGNOSIS — Z8546 Personal history of malignant neoplasm of prostate: Secondary | ICD-10-CM | POA: Diagnosis not present

## 2019-04-25 DIAGNOSIS — E782 Mixed hyperlipidemia: Secondary | ICD-10-CM | POA: Diagnosis not present

## 2019-04-25 DIAGNOSIS — I10 Essential (primary) hypertension: Secondary | ICD-10-CM | POA: Diagnosis not present

## 2019-10-24 DIAGNOSIS — Z8546 Personal history of malignant neoplasm of prostate: Secondary | ICD-10-CM | POA: Diagnosis not present

## 2019-10-24 DIAGNOSIS — Z125 Encounter for screening for malignant neoplasm of prostate: Secondary | ICD-10-CM | POA: Diagnosis not present

## 2019-10-24 DIAGNOSIS — E782 Mixed hyperlipidemia: Secondary | ICD-10-CM | POA: Diagnosis not present

## 2019-10-24 DIAGNOSIS — I10 Essential (primary) hypertension: Secondary | ICD-10-CM | POA: Diagnosis not present

## 2019-10-31 DIAGNOSIS — E785 Hyperlipidemia, unspecified: Secondary | ICD-10-CM | POA: Diagnosis not present

## 2019-10-31 DIAGNOSIS — Z0000 Encounter for general adult medical examination without abnormal findings: Secondary | ICD-10-CM | POA: Diagnosis not present

## 2019-10-31 DIAGNOSIS — Z23 Encounter for immunization: Secondary | ICD-10-CM | POA: Diagnosis not present

## 2020-02-29 DIAGNOSIS — E782 Mixed hyperlipidemia: Secondary | ICD-10-CM | POA: Diagnosis not present

## 2020-03-07 DIAGNOSIS — R0600 Dyspnea, unspecified: Secondary | ICD-10-CM | POA: Diagnosis not present

## 2020-03-07 DIAGNOSIS — R3989 Other symptoms and signs involving the genitourinary system: Secondary | ICD-10-CM | POA: Diagnosis not present

## 2020-03-07 DIAGNOSIS — E782 Mixed hyperlipidemia: Secondary | ICD-10-CM | POA: Diagnosis not present

## 2020-03-07 DIAGNOSIS — E871 Hypo-osmolality and hyponatremia: Secondary | ICD-10-CM | POA: Diagnosis not present

## 2020-03-07 DIAGNOSIS — I10 Essential (primary) hypertension: Secondary | ICD-10-CM | POA: Diagnosis not present

## 2020-03-27 ENCOUNTER — Encounter: Payer: Self-pay | Admitting: Urology

## 2020-03-27 ENCOUNTER — Other Ambulatory Visit: Payer: Self-pay

## 2020-03-27 ENCOUNTER — Ambulatory Visit: Payer: Medicare HMO | Admitting: Urology

## 2020-03-27 VITALS — BP 182/88 | HR 76 | Ht 68.0 in | Wt 200.0 lb

## 2020-03-27 DIAGNOSIS — N5231 Erectile dysfunction following radical prostatectomy: Secondary | ICD-10-CM | POA: Diagnosis not present

## 2020-03-27 DIAGNOSIS — R3989 Other symptoms and signs involving the genitourinary system: Secondary | ICD-10-CM

## 2020-03-27 DIAGNOSIS — Z8546 Personal history of malignant neoplasm of prostate: Secondary | ICD-10-CM

## 2020-03-27 LAB — URINALYSIS, COMPLETE
Bilirubin, UA: NEGATIVE
Glucose, UA: NEGATIVE
Ketones, UA: NEGATIVE
Leukocytes,UA: NEGATIVE
Nitrite, UA: NEGATIVE
Protein,UA: NEGATIVE
Specific Gravity, UA: 1.025 (ref 1.005–1.030)
Urobilinogen, Ur: 0.2 mg/dL (ref 0.2–1.0)
pH, UA: 7.5 (ref 5.0–7.5)

## 2020-03-27 LAB — MICROSCOPIC EXAMINATION
Bacteria, UA: NONE SEEN
Epithelial Cells (non renal): NONE SEEN /hpf (ref 0–10)

## 2020-03-27 MED ORDER — SILDENAFIL CITRATE 20 MG PO TABS
ORAL_TABLET | ORAL | 2 refills | Status: AC
Start: 2020-03-27 — End: ?

## 2020-03-27 NOTE — Progress Notes (Signed)
03/27/2020 1:06 PM   Andre Arias 1945-09-19 [id number]  Referring provider: Dion Body, MD [facility] [place],  [place] [address]  Chief Complaint  Patient presents with  . Urethral pain     HPI: 75 year old male with a remote history of prostate cancer status post prostatectomy (2013 [place]) who presents today for further evaluation of urethral discomfort.  He was seen evaluated by his PCP on 03/07/2020.  At that time, his urinalysis was negative.  His PSA remains undetectable.  Pathology consistent with Gleason 4+3, pT2cN0.  He reports today over the past year or so, he has had several issues.  He describes his issues as a mechanical problem related to his urethra.  He feels that when he does urinate, he has a pressure and pain that radiates through his urethra sometimes under his testicles to the tip of his penis.  He also sometimes feels like he has some pain in his perineum.  This going on for about a year has been really essentially stable.  He also reports he has had a lot of issues with constipation.  He had a colonoscopy last year that showed no hemorrhoids.  History drink plenty of fluids to keep him from getting dehydrated also takes a stool softener.  He reports that when he bears down to have a bowel movement, he sometimes has discomfort in his perineum which radiates to his penis as well.  He also has the same discomfort when he tries to tighten his pelvic floor muscles when he feels like he has to cough or sneeze.  He feels that his stream is relatively strong.  It does not splay.  He does feel like he empties his bladder for the most part.  No significant frequency or urgency.  He reports that he has not had any gross hematuria.  He denies dysuria specifically.  The does not feel like when he has had infections before.  Its different.  He denies any significant incontinence.  He does mention today that he has had  severe erectile dysfunction since his surgery.  He was told that the nerves for supplies erections were cut.  He reports that he was not able to afford sildenafil.  He may be interested in pursuing alternative treatments for this.    PMH: Past Medical History:  Diagnosis Date  . Cancer [facility])    prostate  . Hypertension     Surgical History: Past Surgical History:  Procedure Laterality Date  . LYMPHADENECTOMY  12/23/2011   Procedure: LYMPHADENECTOMY;  Surgeon: Molli Hazard, MD;  Location: [facility] ORS;  Service: Urology;  Laterality: Bilateral;  . ROBOT ASSISTED LAPAROSCOPIC RADICAL PROSTATECTOMY  12/23/2011   Procedure: ROBOTIC ASSISTED LAPAROSCOPIC RADICAL PROSTATECTOMY LEVEL 2;  Surgeon: Molli Hazard, MD;  Location: [facility];  Service: Urology;  Laterality: N/A;      . TONSILLECTOMY     as child    Home Medications:  Allergies as of 03/27/2020   No Known Allergies     Medication List       Accurate as of March 27, 2020  1:06 PM. If you have any questions, ask your nurse or doctor.        STOP taking these medications   bacitracin-neomycin-polymyxin-hydrocortisone 1 % ointment Commonly known as: Cortisporin Stopped by: Hollice Espy, MD   bisacodyl 10 MG suppository Commonly known as: DULCOLAX Stopped by: Hollice Espy, MD   ciprofloxacin 500 MG tablet Commonly known as: Cipro Stopped  by: Hollice Espy, MD   hyoscyamine 0.125 MG tablet Commonly known as: LEVSIN Stopped by: Hollice Espy, MD   oxybutynin 5 MG tablet Commonly known as: DITROPAN Stopped by: Hollice Espy, MD   oxyCODONE-acetaminophen 5-325 MG tablet Commonly known as: Percocet Stopped by: Hollice Espy, MD   senna-docusate 8.6-50 MG tablet Commonly known as: Senokot-S Stopped by: Hollice Espy, MD     TAKE these medications   amLODipine 10 MG tablet Commonly known as: NORVASC   hydrochlorothiazide 12.5 MG tablet Commonly known as: HYDRODIURIL Take 12.5 mg by mouth  daily.   lisinopril 40 MG tablet Commonly known as: ZESTRIL   lisinopril-hydrochlorothiazide 10-12.5 MG tablet Commonly known as: ZESTORETIC Take 1 tablet by mouth every morning.   lovastatin 40 MG tablet Commonly known as: MEVACOR Take 40 mg by mouth daily.   sildenafil 20 MG tablet Commonly known as: REVATIO Take 3-5 tablets as needed 1 hour prior to intercourse Started by: Hollice Espy, MD       Allergies: No Known Allergies  Family History: History reviewed. No pertinent family history.  Social History:  reports that he has never smoked. He has never used smokeless tobacco. He reports that he does not drink alcohol and does not use drugs.   Physical Exam: BP (!) 182/88   Pulse 76   Ht 5\' 8"  (1.727 m)   Wt 200 lb (90.7 kg)   BMI 30.41 kg/m   Constitutional:  Alert and oriented, No acute distress. HEENT: [place] AT, moist mucus membranes.  Trachea midline, no masses. Cardiovascular: No clubbing, cyanosis, or edema. Respiratory: Normal respiratory effort, no increased work of breathing. Skin: No rashes, bruises or suspicious lesions. Neurologic: Grossly intact, no focal deficits, moving all 4 extremities. Psychiatric: Normal mood and affect.  UA today is negative, no evidence of microscopic blood  Assessment & Plan:    1. Urethral pain Suspect this may be secondary to postsurgical adhesions/scarring or possibly even pelvic floor dysfunction as the dysfunction  No evidence of infection is contributing factor  I do think it is reasonable to pursue office cystoscopy to rule out bladder neck contracture urethral foreign body such as a Weck clip based on his symptomology  If this is negative, will refer to physical therapy  He is agreeable this plan - Urinalysis, Complete  2. 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  We discussed  optimal administration.  We did find him a coupon for sildenafil at [employer] which is around $15 for 90 tablets.  He may need to use up to 5 tablets of once 1 hour prior to intercourse for optimal effect.  He does not believe he will be able to afford injections.  We also discussed the possibility of penile prosthesis.  He will let us know if he like to pursue this in the future.  3. History of prostate cancer NED, PSA remains undetectable  Schedule cysto  Hollice Espy, MD  [facility] [address], [place], [place] [address] [phone]

## 2020-03-27 NOTE — Patient Instructions (Signed)
Cystoscopy Cystoscopy is a procedure that is used to help diagnose and sometimes treat conditions that affect the lower urinary tract. The lower urinary tract includes the bladder and the urethra. The urethra is the tube that drains urine from the bladder. Cystoscopy is done using a thin, tube-shaped instrument with a light and camera at the end (cystoscope). The cystoscope may be hard or flexible, depending on the goal of the procedure. The cystoscope is inserted through the urethra, into the bladder. Cystoscopy may be recommended if you have:  Urinary tract infections that keep coming back.  Blood in the urine (hematuria).  An inability to control when you urinate (urinary incontinence) or an overactive bladder.  Unusual cells found in a urine sample.  A blockage in the urethra, such as a urinary stone.  Painful urination.  An abnormality in the bladder found during an intravenous pyelogram (IVP) or CT scan. Cystoscopy may also be done to remove a sample of tissue to be examined under a microscope (biopsy). What are the risks? Generally, this is a safe procedure. However, problems may occur, including:  Infection.  Bleeding.  What happens during the procedure?  1. You will be given one or more of the following: ? A medicine to numb the area (local anesthetic). 2. The area around the opening of your urethra will be cleaned. 3. The cystoscope will be passed through your urethra into your bladder. 4. Germ-free (sterile) fluid will flow through the cystoscope to fill your bladder. The fluid will stretch your bladder so that your health care provider can clearly examine your bladder walls. 5. Your doctor will look at the urethra and bladder. 6. The cystoscope will be removed The procedure may vary among health care providers  What can I expect after the procedure? After the procedure, it is common to have: 1. Some soreness or pain in your abdomen and urethra. 2. Urinary symptoms.  These include: ? Mild pain or burning when you urinate. Pain should stop within a few minutes after you urinate. This may last for up to 1 week. ? A small amount of blood in your urine for several days. ? Feeling like you need to urinate but producing only a small amount of urine. Follow these instructions at home: General instructions  Return to your normal activities as told by your health care provider.   Do not drive for 24 hours if you were given a sedative during your procedure.  Watch for any blood in your urine. If the amount of blood in your urine increases, call your health care provider.  If a tissue sample was removed for testing (biopsy) during your procedure, it is up to you to get your test results. Ask your health care provider, or the department that is doing the test, when your results will be ready.  Drink enough fluid to keep your urine pale yellow.  Keep all follow-up visits as told by your health care provider. This is important. Contact a health care provider if you:  Have pain that gets worse or does not get better with medicine, especially pain when you urinate.  Have trouble urinating.  Have more blood in your urine. Get help right away if you:  Have blood clots in your urine.  Have abdominal pain.  Have a fever or chills.  Are unable to urinate. Summary  Cystoscopy is a procedure that is used to help diagnose and sometimes treat conditions that affect the lower urinary tract.  Cystoscopy is done using   a thin, tube-shaped instrument with a light and camera at the end.  After the procedure, it is common to have some soreness or pain in your abdomen and urethra.  Watch for any blood in your urine. If the amount of blood in your urine increases, call your health care provider.  If you were prescribed an antibiotic medicine, take it as told by your health care provider. Do not stop taking the antibiotic even if you start to feel better. This  information is not intended to replace advice given to you by your health care provider. Make sure you discuss any questions you have with your health care provider. Document Revised: 01/25/2018 Document Reviewed: 01/25/2018 Elsevier Patient Education  2020 [employer]   

## 2020-04-23 ENCOUNTER — Encounter: Payer: Self-pay | Admitting: Urology

## 2020-04-23 ENCOUNTER — Other Ambulatory Visit: Payer: Self-pay

## 2020-04-23 ENCOUNTER — Ambulatory Visit: Payer: Medicare HMO | Admitting: Urology

## 2020-04-23 VITALS — BP 185/79 | HR 77 | Ht 68.0 in | Wt 200.0 lb

## 2020-04-23 DIAGNOSIS — N210 Calculus in bladder: Secondary | ICD-10-CM

## 2020-04-23 DIAGNOSIS — R3989 Other symptoms and signs involving the genitourinary system: Secondary | ICD-10-CM | POA: Diagnosis not present

## 2020-04-23 DIAGNOSIS — Z8546 Personal history of malignant neoplasm of prostate: Secondary | ICD-10-CM

## 2020-04-23 NOTE — Progress Notes (Signed)
   04/23/20  CC:  Chief Complaint  Patient presents with  . Cysto    HPI: 75 year old male with intermittent dysuria, pelvic pain following radical prostatectomy who presents today for cystoscopic evaluation.  Please see previous notes for details.  Blood pressure (!) 185/79, pulse 77, height 5\' 8"  (1.727 m), weight 200 lb (90.7 kg). NED. A&Ox3.   No respiratory distress   Abd soft, NT, ND Normal phallus with bilateral descended testicles  Cystoscopy Procedure Note  Patient identification was confirmed, informed consent was obtained, and patient was prepped using Betadine solution.  Lidocaine jelly was administered per urethral meatus.     Pre-Procedure: - Inspection reveals a normal caliber ureteral meatus.  Procedure: The flexible cystoscope was introduced without difficulty - No urethral strictures/lesions are present. - Surgically absent prostate  -Stone occluding bladder neck, appears to be attached at the 12 o'clock position, approximately 1 cm - Bilateral ureteral orifices identified - Bladder mucosa  reveals no ulcers, tumors, or lesions - No bladder stones - No trabeculation  Retroflexion shows 1 cm stone adherent to bladder neck, at the base of the stone there appears to possibly be a surgical staple   Post-Procedure: - Patient tolerated the procedure well  Assessment/ Plan:  1. Bladder stone Small 1 cm bladder stone likely attached to urethral foreign body possibly surgical staple which may have been used to ligate the DVC at the time of prostatectomy  I recommended proceeding to the operating room for cystolitholapaxy as well as urethral foreign body removal.  We discussed risk including risk of bleeding, infection, damage surrounding structures, worsening of urinary incontinence, possible need for postoperative Foley catheter amongst others.  He is agreeable this plan.  All questions answered.  2. Urethral pain Secondary to #1 - Urinalysis,  Complete - CULTURE, URINE COMPREHENSIVE  3. History of prostate cancer NED    Hollice Espy, MD

## 2020-04-24 LAB — URINALYSIS, COMPLETE
Bilirubin, UA: NEGATIVE
Glucose, UA: NEGATIVE
Ketones, UA: NEGATIVE
Leukocytes,UA: NEGATIVE
Nitrite, UA: NEGATIVE
Protein,UA: NEGATIVE
RBC, UA: NEGATIVE
Specific Gravity, UA: 1.02 (ref 1.005–1.030)
Urobilinogen, Ur: 0.2 mg/dL (ref 0.2–1.0)
pH, UA: 7 (ref 5.0–7.5)

## 2020-04-24 LAB — MICROSCOPIC EXAMINATION
Bacteria, UA: NONE SEEN
Epithelial Cells (non renal): NONE SEEN /hpf (ref 0–10)

## 2020-04-25 LAB — CULTURE, URINE COMPREHENSIVE

## 2020-04-29 ENCOUNTER — Other Ambulatory Visit: Payer: Self-pay | Admitting: Urology

## 2020-04-29 DIAGNOSIS — R3989 Other symptoms and signs involving the genitourinary system: Secondary | ICD-10-CM

## 2020-04-29 DIAGNOSIS — N210 Calculus in bladder: Secondary | ICD-10-CM

## 2020-05-14 ENCOUNTER — Encounter: Payer: Self-pay | Admitting: Urgent Care

## 2020-05-14 ENCOUNTER — Encounter
Admission: RE | Admit: 2020-05-14 | Discharge: 2020-05-14 | Disposition: A | Discharge status: Home / Self Care | Payer: Medicare HMO | Source: Ambulatory Visit | Attending: Urology | Admitting: Urology

## 2020-05-14 ENCOUNTER — Other Ambulatory Visit: Payer: Self-pay

## 2020-05-14 ENCOUNTER — Encounter: Payer: Self-pay | Admitting: Certified Registered Nurse Anesthetist

## 2020-05-14 HISTORY — DX: Pure hypercholesterolemia, unspecified: E78.00

## 2020-05-14 NOTE — Patient Instructions (Signed)
Your procedure is scheduled on: May 20, 2020 MONDAY Report to the Registration Desk on the 1st floor of the [facility]. To find out your arrival time, please call [phone] between 1PM - 3PM on: Friday May 17, 2020  REMEMBER: Instructions that are not followed completely may result in serious medical risk, up to and including death; or upon the discretion of your surgeon and anesthesiologist your surgery may need to be rescheduled.  Do not eat OR DRINK after midnight the night before surgery.  No gum chewing, lozengers or hard candies.   TAKE THESE MEDICATIONS THE MORNING OF SURGERY WITH A SIP OF WATER: AMLODIPINE  One week prior to surgery: Stop Anti-inflammatories (NSAIDS) such as Advil, Aleve, Ibuprofen, Motrin, Naproxen, Naprosyn and ASPIRIN Aspirin based products such as Excedrin, Goodys Powder, BC Powder. ONLY TAKE TYLENOL IF NEEDED Stop ANY OVER THE COUNTER supplements until after surgery. CONTINUE MULTIVITAMIN - JUST NOT THE DAY OF SURGERY  No Alcohol for 24 hours before or after surgery.  No Smoking including e-cigarettes for 24 hours prior to surgery.  No chewable tobacco products for at least 6 hours prior to surgery.  No nicotine patches on the day of surgery.  Do not use any "recreational" drugs for at least a week prior to your surgery.  Please be advised that the combination of cocaine and anesthesia may have negative outcomes, up to and including death. If you test positive for cocaine, your surgery will be cancelled.  On the morning of surgery brush your teeth with toothpaste and water, you may rinse your mouth with mouthwash if you wish. Do not swallow any toothpaste or mouthwash.  Do not wear jewelry, make-up, hairpins, clips or nail polish.  Do not wear lotions, powders, or perfumes OR DEODORANT   Do not shave body from the neck down 48 hours prior to surgery just in case you cut yourself which could leave a site for infection.  Also, freshly shaved  skin may become irritated if using the CHG soap.  Contact lenses, hearing aids and dentures may not be worn into surgery.  Do not bring valuables to the hospital. [facility] is not responsible for any missing/lost belongings or valuables.   SHOWER MORNING OF SURGERY  Notify your doctor if there is any change in your medical condition (cold, fever, infection).  Wear comfortable clothing (specific to your surgery type) to the hospital.  Plan for stool softeners for home use; pain medications have a tendency to cause constipation. You can also help prevent constipation by eating foods high in fiber such as fruits and vegetables and drinking plenty of fluids as your diet allows.  After surgery, you can help prevent lung complications by doing breathing exercises.  Take deep breaths and cough every 1-2 hours. Your doctor may order a device called an Incentive Spirometer to help you take deep breaths. When coughing or sneezing, hold a pillow firmly against your incision with both hands. This is called "splinting." Doing this helps protect your incision. It also decreases belly discomfort.  If you are being discharged the day of surgery, you will not be allowed to drive home. You will need a responsible adult (18 years or older) to drive you home and stay with you that night.   Please call the [facility] Dept. at [phone] if you have any questions about these instructions.  Surgery Visitation Policy:  Patients undergoing a surgery or procedure may have one family member or support person with them as  long as that person is not COVID-19 positive or experiencing its symptoms.  That person may remain in the waiting area during the procedure.  Inpatient Visitation:    Visiting hours are 7 a.m. to 8 p.m. Inpatients will be allowed two visitors daily. The visitors may change each day during the patient's stay. No visitors under the age of 48. Any visitor under the age of 6  must be accompanied by an adult. The visitor must pass COVID-19 screenings, use hand sanitizer when entering and exiting the patient's room and wear a mask at all times, including in the patient's room. Patients must also wear a mask when staff or their visitor are in the room. Masking is required regardless of vaccination status.

## 2020-05-16 ENCOUNTER — Other Ambulatory Visit
Admission: RE | Admit: 2020-05-16 | Discharge: 2020-05-16 | Disposition: A | Discharge status: Home / Self Care | Payer: Medicare HMO | Source: Ambulatory Visit | Attending: Urology | Admitting: Urology

## 2020-05-16 ENCOUNTER — Other Ambulatory Visit: Payer: Self-pay

## 2020-05-16 DIAGNOSIS — Z01812 Encounter for preprocedural laboratory examination: Secondary | ICD-10-CM | POA: Diagnosis not present

## 2020-05-16 DIAGNOSIS — Z20822 Contact with and (suspected) exposure to covid-19: Secondary | ICD-10-CM | POA: Diagnosis not present

## 2020-05-16 LAB — SARS CORONAVIRUS 2 (TAT 6-24 HRS): SARS Coronavirus 2: NEGATIVE

## 2020-05-19 MED ORDER — FAMOTIDINE 20 MG PO TABS: 20.0000 mg | ORAL_TABLET | Freq: Once | ORAL | Status: DC

## 2020-05-19 MED ORDER — CEFAZOLIN SODIUM-DEXTROSE 2-4 GM/100ML-% IV SOLN: 2.0000 g | INTRAVENOUS | Status: DC

## 2020-05-20 ENCOUNTER — Ambulatory Visit: Admission: RE | Admit: 2020-05-20 | Payer: Medicare HMO | Source: Home / Self Care | Admitting: Urology

## 2020-05-20 ENCOUNTER — Encounter: Admission: RE | Payer: Self-pay | Source: Home / Self Care

## 2020-05-20 DIAGNOSIS — R3989 Other symptoms and signs involving the genitourinary system: Secondary | ICD-10-CM

## 2020-05-20 DIAGNOSIS — N210 Calculus in bladder: Secondary | ICD-10-CM

## 2020-05-20 DIAGNOSIS — T190XXD Foreign body in urethra, subsequent encounter: Secondary | ICD-10-CM

## 2020-05-20 SURGERY — CYSTOSCOPY, WITH BLADDER CALCULUS LITHOLAPAXY
Anesthesia: Choice

## 2020-05-23 ENCOUNTER — Other Ambulatory Visit
Admission: RE | Admit: 2020-05-23 | Discharge: 2020-05-23 | Disposition: A | Discharge status: Home / Self Care | Payer: Medicare HMO | Source: Ambulatory Visit | Attending: Urology | Admitting: Urology

## 2020-05-23 ENCOUNTER — Other Ambulatory Visit: Payer: Self-pay

## 2020-05-23 DIAGNOSIS — Z01812 Encounter for preprocedural laboratory examination: Secondary | ICD-10-CM | POA: Diagnosis not present

## 2020-05-23 DIAGNOSIS — Z20822 Contact with and (suspected) exposure to covid-19: Secondary | ICD-10-CM | POA: Diagnosis not present

## 2020-05-23 LAB — SARS CORONAVIRUS 2 (TAT 6-24 HRS): SARS Coronavirus 2: NEGATIVE

## 2020-05-27 ENCOUNTER — Ambulatory Visit
Admission: RE | Admit: 2020-05-27 | Discharge: 2020-05-27 | Disposition: A | Discharge status: Home / Self Care | Payer: Medicare HMO | Attending: Urology | Admitting: Urology

## 2020-05-27 ENCOUNTER — Encounter
Admission: RE | Disposition: A | Discharge status: Home / Self Care | Payer: Self-pay | Source: Home / Self Care | Attending: Urology

## 2020-05-27 ENCOUNTER — Other Ambulatory Visit: Payer: Self-pay

## 2020-05-27 ENCOUNTER — Ambulatory Visit: Payer: Medicare HMO | Admitting: Certified Registered Nurse Anesthetist

## 2020-05-27 ENCOUNTER — Encounter: Payer: Self-pay | Admitting: Urology

## 2020-05-27 ENCOUNTER — Ambulatory Visit: Payer: Medicare HMO

## 2020-05-27 DIAGNOSIS — I10 Essential (primary) hypertension: Secondary | ICD-10-CM | POA: Diagnosis not present

## 2020-05-27 DIAGNOSIS — T190XXA Foreign body in urethra, initial encounter: Secondary | ICD-10-CM | POA: Insufficient documentation

## 2020-05-27 DIAGNOSIS — X58XXXA Exposure to other specified factors, initial encounter: Secondary | ICD-10-CM | POA: Diagnosis not present

## 2020-05-27 DIAGNOSIS — N210 Calculus in bladder: Secondary | ICD-10-CM | POA: Diagnosis not present

## 2020-05-27 DIAGNOSIS — T190XXD Foreign body in urethra, subsequent encounter: Secondary | ICD-10-CM

## 2020-05-27 DIAGNOSIS — Z8546 Personal history of malignant neoplasm of prostate: Secondary | ICD-10-CM | POA: Insufficient documentation

## 2020-05-27 DIAGNOSIS — Z9079 Acquired absence of other genital organ(s): Secondary | ICD-10-CM | POA: Diagnosis not present

## 2020-05-27 DIAGNOSIS — T191XXA Foreign body in bladder, initial encounter: Secondary | ICD-10-CM | POA: Diagnosis not present

## 2020-05-27 HISTORY — PX: CYSTOSCOPY WITH LITHOLAPAXY: SHX1425

## 2020-05-27 HISTORY — PX: CYSTOSCOPY W/ URETERAL STENT REMOVAL: SHX1430

## 2020-05-27 SURGERY — CYSTOSCOPY, WITH BLADDER CALCULUS LITHOLAPAXY
Anesthesia: General

## 2020-05-27 MED ORDER — OXYBUTYNIN CHLORIDE 5 MG PO TABS: 5.0000 mg | ORAL_TABLET | Freq: Three times a day (TID) | ORAL | 0 refills | Status: DC | PRN

## 2020-05-27 MED ORDER — ORAL CARE MOUTH RINSE: 15.0000 mL | Freq: Once | OROMUCOSAL | Status: AC

## 2020-05-27 MED ORDER — FENTANYL CITRATE (PF) 100 MCG/2ML IJ SOLN: 25.0000 ug | INTRAMUSCULAR | Status: DC | PRN

## 2020-05-27 MED ORDER — CEFAZOLIN SODIUM-DEXTROSE 2-3 GM-%(50ML) IV SOLR
INTRAVENOUS | Status: DC | PRN
  Administered 2020-05-27: 2 g via INTRAVENOUS

## 2020-05-27 MED ORDER — LIDOCAINE HCL (CARDIAC) PF 100 MG/5ML IV SOSY
PREFILLED_SYRINGE | INTRAVENOUS | Status: DC | PRN
  Administered 2020-05-27: 80 mg via INTRAVENOUS

## 2020-05-27 MED ORDER — ONDANSETRON HCL 4 MG/2ML IJ SOLN: 4.0000 mg | Freq: Once | INTRAMUSCULAR | Status: DC | PRN

## 2020-05-27 MED ORDER — HYDROCODONE-ACETAMINOPHEN 5-325 MG PO TABS: 1.0000 | ORAL_TABLET | Freq: Four times a day (QID) | ORAL | 0 refills | Status: DC | PRN

## 2020-05-27 MED ORDER — CHLORHEXIDINE GLUCONATE 0.12 % MT SOLN
OROMUCOSAL | Status: AC
  Administered 2020-05-27: 15 mL via OROMUCOSAL
  Filled 2020-05-27: qty 15

## 2020-05-27 MED ORDER — ACETAMINOPHEN 10 MG/ML IV SOLN
INTRAVENOUS | Status: AC
  Filled 2020-05-27: qty 100

## 2020-05-27 MED ORDER — DEXAMETHASONE SODIUM PHOSPHATE 10 MG/ML IJ SOLN
INTRAMUSCULAR | Status: DC | PRN
  Administered 2020-05-27: 10 mg via INTRAVENOUS

## 2020-05-27 MED ORDER — PROPOFOL 10 MG/ML IV BOLUS
INTRAVENOUS | Status: DC | PRN
  Administered 2020-05-27: 160 mg via INTRAVENOUS

## 2020-05-27 MED ORDER — ACETAMINOPHEN 10 MG/ML IV SOLN
INTRAVENOUS | Status: DC | PRN
  Administered 2020-05-27: 1000 mg via INTRAVENOUS

## 2020-05-27 MED ORDER — PHENYLEPHRINE HCL (PRESSORS) 10 MG/ML IV SOLN
INTRAVENOUS | Status: DC | PRN
  Administered 2020-05-27 (×4): 100 ug via INTRAVENOUS

## 2020-05-27 MED ORDER — CHLORHEXIDINE GLUCONATE 0.12 % MT SOLN: 15.0000 mL | Freq: Once | OROMUCOSAL | Status: AC

## 2020-05-27 MED ORDER — ONDANSETRON HCL 4 MG/2ML IJ SOLN
INTRAMUSCULAR | Status: DC | PRN
  Administered 2020-05-27: 4 mg via INTRAVENOUS

## 2020-05-27 MED ORDER — LACTATED RINGERS IV SOLN: INTRAVENOUS | Status: DC

## 2020-05-27 MED ORDER — FENTANYL CITRATE (PF) 100 MCG/2ML IJ SOLN
INTRAMUSCULAR | Status: AC
  Filled 2020-05-27: qty 2

## 2020-05-27 MED ORDER — LACTATED RINGERS IV SOLN: INTRAVENOUS | Status: DC | PRN

## 2020-05-27 MED ORDER — PROPOFOL 10 MG/ML IV BOLUS
INTRAVENOUS | Status: AC
  Filled 2020-05-27: qty 20

## 2020-05-27 MED ORDER — FENTANYL CITRATE (PF) 100 MCG/2ML IJ SOLN
INTRAMUSCULAR | Status: DC | PRN
  Administered 2020-05-27 (×2): 25 ug via INTRAVENOUS

## 2020-05-27 SURGICAL SUPPLY — 27 items
BAG DRAIN CYSTO-URO LG1000N (MISCELLANEOUS) ×2 IMPLANT
BAG DRN RND TRDRP ANRFLXCHMBR (UROLOGICAL SUPPLIES) ×1
BAG URINE DRAIN 2000ML AR STRL (UROLOGICAL SUPPLIES) ×2 IMPLANT
BASKET ZERO TIP 1.9FR (BASKET) IMPLANT
BSKT STON RTRVL ZERO TP 1.9FR (BASKET)
CATH FOLEY 2WAY 18X30 (CATHETERS) ×1 IMPLANT
CATH FOLEY 2WAY SIL 18X30 (CATHETERS) ×2
CNTNR SPEC 2.5X3XGRAD LEK (MISCELLANEOUS) ×1
CONT SPEC 4OZ STER OR WHT (MISCELLANEOUS) ×1
CONT SPEC 4OZ STRL OR WHT (MISCELLANEOUS) ×1
CONTAINER SPEC 2.5X3XGRAD LEK (MISCELLANEOUS) ×1 IMPLANT
COVER WAND RF STERILE (DRAPES) ×2 IMPLANT
GLOVE SURG ENC MOIS LTX SZ6.5 (GLOVE) ×2 IMPLANT
GOWN STRL REUS W/ TWL LRG LVL3 (GOWN DISPOSABLE) ×2 IMPLANT
GOWN STRL REUS W/TWL LRG LVL3 (GOWN DISPOSABLE) ×4
GUIDEWIRE STR DUAL SENSOR (WIRE) IMPLANT
HOLDER FOLEY CATH W/STRAP (MISCELLANEOUS) ×2 IMPLANT
IV NS IRRIG 3000ML ARTHROMATIC (IV SOLUTION) ×4 IMPLANT
KIT TURNOVER CYSTO (KITS) ×2 IMPLANT
PACK CYSTO AR (MISCELLANEOUS) ×2 IMPLANT
SET CYSTO W/LG BORE CLAMP LF (SET/KITS/TRAYS/PACK) ×2 IMPLANT
SET IRRIG Y TYPE TUR BLADDER L (SET/KITS/TRAYS/PACK) ×2 IMPLANT
SURGILUBE 2OZ TUBE FLIPTOP (MISCELLANEOUS) ×2 IMPLANT
SYR TOOMEY IRRIG 70ML (MISCELLANEOUS) ×2
SYRINGE TOOMEY IRRIG 70ML (MISCELLANEOUS) ×1 IMPLANT
WATER STERILE IRR 1000ML POUR (IV SOLUTION) ×2 IMPLANT
WATER STERILE IRR 3000ML UROMA (IV SOLUTION) IMPLANT

## 2020-05-27 NOTE — Anesthesia Procedure Notes (Signed)
Procedure Name: LMA Insertion Date/Time: 05/27/2020 7:47 AM Performed by: Willette Alma, CRNA Pre-anesthesia Checklist: Patient identified, Patient being monitored, Timeout performed, Emergency Drugs available and Suction available Patient Re-evaluated:Patient Re-evaluated prior to induction Oxygen Delivery Method: Circle system utilized Preoxygenation: Pre-oxygenation with 100% oxygen Induction Type: IV induction Ventilation: Mask ventilation without difficulty LMA: LMA inserted LMA Size: 5.0 Tube type: Oral Number of attempts: 1 Placement Confirmation: positive ETCO2 and breath sounds checked- equal and bilateral Tube secured with: Tape Dental Injury: Teeth and Oropharynx as per pre-operative assessment

## 2020-05-27 NOTE — Discharge Instructions (Addendum)
AMBULATORY SURGERY  DISCHARGE INSTRUCTIONS   1) The drugs that you were given will stay in your system until tomorrow so for the next 24 hours you should not:  A) Drive an automobile B) Make any legal decisions C) Drink any alcoholic beverage   2) You may resume regular meals tomorrow.  Today it is better to start with liquids and gradually work up to solid foods.  You may eat anything you prefer, but it is better to start with liquids, then soup and crackers, and gradually work up to solid foods.   3) Please notify your doctor immediately if you have any unusual bleeding, trouble breathing, redness and pain at the surgery site, drainage, fever, or pain not relieved by medication. 4)   5) Your post-operative visit with Dr.                                     is: Date:                        Time:    Please call to schedule your post-operative visit.  6) Additional Instructions:     Indwelling Urinary Catheter Care, Adult An indwelling urinary catheter is a thin tube that is put into your bladder. The tube helps to drain pee (urine) out of your body. The tube goes in through your urethra. Your urethra is where pee comes out of your body. Your pee will come out through the catheter, then it will go into a bag (drainage bag). Take good care of your catheter so it will work well. How to wear your catheter and bag Supplies needed  Sticky tape (adhesive tape) or a leg strap.  Alcohol wipe or soap and water (if you use tape).  A clean towel (if you use tape).  Large overnight bag.  Smaller bag (leg bag). Wearing your catheter Attach your catheter to your leg with tape or a leg strap.  Make sure the catheter is not pulled tight.  If a leg strap gets wet, take it off and put on a dry strap.  If you use tape to hold the bag on your leg: 1. Use an alcohol wipe or soap and water to wash your skin where the tape made it sticky before. 2. Use a clean towel to pat-dry that  skin. 3. Use new tape to make the bag stay on your leg. Wearing your bags You should have been given a large overnight bag.  You may wear the overnight bag in the day or night.  Always have the overnight bag lower than your bladder.  Do not let the bag touch the floor.  Before you go to sleep, put a clean plastic bag in a wastebasket. Then hang the overnight bag inside the wastebasket. You should also have a smaller leg bag that fits under your clothes.  Always wear the leg bag below your knee.  Do not wear your leg bag at night. How to care for your skin and catheter Supplies needed  A clean washcloth.  Water and mild soap.  A clean towel. Caring for your skin and catheter  Clean the skin around your catheter every day: 1. Wash your hands with soap and water. 2. Wet a clean washcloth in warm water and mild soap. 3. Clean the skin around your urethra.  If you are male:  Gently spread the  folds of skin around your vagina (labia).  With the washcloth in your other hand, wipe the inner side of your labia on each side. Wipe from front to back.  If you are male:  Pull back any skin that covers the end of your penis (foreskin).  With the washcloth in your other hand, wipe your penis in small circles. Start wiping at the tip of your penis, then move away from the catheter.  Move the foreskin back in place, if needed. 4. With your free hand, hold the catheter close to where it goes into your body.  Keep holding the catheter during cleaning so it does not get pulled out. 5. With the washcloth in your other hand, clean the catheter.  Only wipe downward on the catheter.  Do not wipe upward toward your body. Doing this may push germs into your urethra and cause infection. 6. Use a clean towel to pat-dry the catheter and the skin around it. Make sure to wipe off all soap. 7. Wash your hands with soap and water.  Shower every day. Do not take baths.  Do not use cream,  ointment, or lotion on the area where the catheter goes into your body, unless your doctor tells you to.  Do not use powders, sprays, or lotions on your genital area.  Check your skin around the catheter every day for signs of infection. Check for: ? Redness, swelling, or pain. ? Fluid or blood. ? Warmth. ? Pus or a bad smell.      How to empty the bag Supplies needed  Rubbing alcohol.  Gauze pad or cotton ball.  Tape or a leg strap. Emptying the bag Pour the pee out of your bag when it is ?- full, or at least 2-3 times a day. Do this for your overnight bag and your leg bag. 1. Wash your hands with soap and water. 2. Separate (detach) the bag from your leg. 3. Hold the bag over the toilet or a clean pail. Keep the bag lower than your hips and bladder. This is so the pee (urine) does not go back into the tube. 4. Open the pour spout. It is at the bottom of the bag. 5. Empty the pee into the toilet or pail. Do not let the pour spout touch any surface. 6. Put rubbing alcohol on a gauze pad or cotton ball. 7. Use the gauze pad or cotton ball to clean the pour spout. 8. Close the pour spout. 9. Attach the bag to your leg with tape or a leg strap. 10. Wash your hands with soap and water. Follow instructions for cleaning the drainage bag:  From the product maker.  As told by your doctor. How to change the bag Supplies needed  Alcohol wipes.  A clean bag.  Tape or a leg strap. Changing the bag Replace your bag when it starts to leak, smell bad, or look dirty. 1. Wash your hands with soap and water. 2. Separate the dirty bag from your leg. 3. Pinch the catheter with your fingers so that pee does not spill out. 4. Separate the catheter tube from the bag tube where these tubes connect (at the connection valve). Do not let the tubes touch any surface. 5. Clean the end of the catheter tube with an alcohol wipe. Use a different alcohol wipe to clean the end of the bag  tube. 6. Connect the catheter tube to the tube of the clean bag. 7. Attach the clean bag to  your leg with tape or a leg strap. Do not make the bag tight on your leg. 8. Wash your hands with soap and water. General rules  Never pull on your catheter. Never try to take it out. Doing that can hurt you.  Always wash your hands before and after you touch your catheter or bag. Use a mild, fragrance-free soap. If you do not have soap and water, use hand sanitizer.  Always make sure there are no twists or bends (kinks) in the catheter tube.  Always make sure there are no leaks in the catheter or bag.  Drink enough fluid to keep your pee pale yellow.  Do not take baths, swim, or use a hot tub.  If you are male, wipe from front to back after you poop (have a bowel movement).   Contact a doctor if:  Your pee is cloudy.  Your pee smells worse than usual.  Your catheter gets clogged.  Your catheter leaks.  Your bladder feels full. Get help right away if:  You have redness, swelling, or pain where the catheter goes into your body.  You have fluid, blood, pus, or a bad smell coming from the area where the catheter goes into your body.  Your skin feels warm where the catheter goes into your body.  You have a fever.  You have pain in your: ? Belly (abdomen). ? Legs. ? Lower back. ? Bladder.  You see blood in the catheter.  Your pee is pink or red.  You feel sick to your stomach (nauseous).  You throw up (vomit).  You have chills.  Your pee is not draining into the bag.  Your catheter gets pulled out. Summary  An indwelling urinary catheter is a thin tube that is placed into the bladder to help drain pee (urine) out of the body.  The catheter is placed into the part of the body that drains pee from the bladder (urethra).  Taking good care of your catheter will keep it working properly and help prevent problems.  Always wash your hands before and after touching your  catheter or bag.  Never pull on your catheter or try to take it out. This information is not intended to replace advice given to you by your health care provider. Make sure you discuss any questions you have with your health care provider. Document Revised: 05/27/2018 Document Reviewed: 09/18/2016 Elsevier Patient Education  2021 [employer] INSTRUCTIONS   7) The drugs that you were given will stay in your system until tomorrow so for the next 24 hours you should not:  D) Drive an automobile E) Make any legal decisions F) Drink any alcoholic beverage   8) You may resume regular meals tomorrow.  Today it is better to start with liquids and gradually work up to solid foods.  You may eat anything you prefer, but it is better to start with liquids, then soup and crackers, and gradually work up to solid foods.   9) Please notify your doctor immediately if you have any unusual bleeding, trouble breathing, redness and pain at the surgery site, drainage, fever, or pain not relieved by medication.    10) Additional Instructions:        Please contact your physician with any problems or Same Day Surgery at [phone], Monday through Friday 6 am to 4 pm, or [place] at [facility] number at [phone].

## 2020-05-27 NOTE — Transfer of Care (Signed)
Immediate Anesthesia Transfer of Care Note  Patient: Andre Arias  Procedure(s) Performed: CYSTOSCOPY WITH LITHOLAPAXY (N/A ) CYSTOSCOPY WITH FOREIGN BODY REMOVAL (STAPLE) (N/A )  Patient Location: PACU  Anesthesia Type:General  Level of Consciousness: awake, alert  and oriented  Airway & Oxygen Therapy: Patient Spontanous Breathing and Patient connected to face mask oxygen  Post-op Assessment: Report given to RN and Post -op Vital signs reviewed and stable  Post vital signs: Reviewed and stable  Last Vitals:  Vitals Value Taken Time  BP 112/65 05/27/20 0821  Temp    Pulse 61 05/27/20 0824  Resp 8 05/27/20 0824  SpO2 99 % 05/27/20 0824  Vitals shown include unvalidated device data.  Last Pain:  Vitals:   05/27/20 3428  PainSc: 0-No pain         Complications: No complications documented.

## 2020-05-27 NOTE — Op Note (Signed)
Date of procedure: 05/27/20  Preoperative diagnosis:  1. Bladder stones 2. Urethral foreign body (surgical staples)  Postoperative diagnosis:  1. Same as above  Procedure: 1. Cystoscopy 2. Cystolitholapaxy 3. Removal of urethral foreign body  Surgeon: Hollice Espy, MD  Anesthesia: General  Complications: None  Intraoperative findings: Small stones able to be evacuated from bladder using 21 French cystoscope sheath after fragmenting slightly with the beak of scope.  5 surgical staples removed from bladder neck/urethra at the 11 o'clock position.  No residual additional foreign body appreciated.  EBL: Minimal  Specimens: Surgical staple x5 with stone adherent for gross  Drains: 18 French Foley with 30 cc balloon  Indication: Andre Arias is a 75 y.o. patient with personal history of prostate cancer status post radical prostatectomy with ongoing dysuria/urethral pain found to have urethral foreign body with adherent stone.  After reviewing the management options for treatment, he elected to proceed with the above surgical procedure(s). We have discussed the potential benefits and risks of the procedure, side effects of the proposed treatment, the likelihood of the patient achieving the goals of the procedure, and any potential problems that might occur during the procedure or recuperation. Informed consent has been obtained.  Description of procedure:  The patient was taken to the operating room and general anesthesia was induced.  The patient was placed in the dorsal lithotomy position, prepped and draped in the usual sterile fashion, and preoperative antibiotics were administered. A preoperative time-out was performed.   54 French cystoscope was advanced per urethra into the bladder.  Prostate was surgically absent and the bladder neck was able to accommodate the 21 French cystoscope fairly easily.  Immediately, with just the manipulation from the scope, a free-floating  stable and stone was seen within the lumen of the bladder.  I used the beak of the scope to crush the stone slightly into 2 pieces and was able to evacuate the stone as well as stable from the bladder (no larger than 5 mm each).  I then initially scope the bladder and no obvious stone or additional staples were appreciated.  I then brought in a 70 degree scope however with this, noted a series of at least 3 or 4 visible staples at the bladder neck within the urethra at the 11 o'clock position.  Picture was captured.  I used a grasper to remove each and every one of the staples pulling them into the bladder and then eventually evacuating from them from the bladder itself.  I used a 70 degree scope again to completely evaluate this area no additional visible foreign bodies were appreciated.  The bladder was completely free of all staples.  There is a small amount of ragged/traumatized tissue the 11 o'clock position otherwise the bladder neck was completely intact.  I elected to leave Foley catheter, 15 [place] which went easily with 30 cc in the balloon to allow for the bladder neck to heal postoperatively.  Patient was then cleaned and dried, repositioned in supine position and taken to the PACU in stable condition.  Plan: Intraoperative findings were discussed with the patient's wife and she was provided with pictures.  We will plan to leave the Foley catheter in place for 72 hours.  Patient may remove his own catheter at home recommend for Foley removal.  We will reassess his symptoms in about 6 weeks.  All questions were answered.  Hollice Espy, M.D.

## 2020-05-27 NOTE — Anesthesia Preprocedure Evaluation (Addendum)
Anesthesia Evaluation  Patient identified by MRN, date of birth, ID band Patient awake    Reviewed: Allergy & Precautions, NPO status , Patient's Chart, lab work & pertinent test results  History of Anesthesia Complications Negative for: history of anesthetic complications  Airway Mallampati: II       Dental   Pulmonary neg sleep apnea, neg COPD, Not current smoker,           Cardiovascular hypertension, Pt. on medications (-) Past MI and (-) CHF (-) dysrhythmias (-) Valvular Problems/Murmurs     Neuro/Psych neg Seizures    GI/Hepatic Neg liver ROS, neg GERD  ,  Endo/Other  neg diabetes  Renal/GU negative Renal ROS     Musculoskeletal   Abdominal   Peds  Hematology   Anesthesia Other Findings   Reproductive/Obstetrics                             Anesthesia Physical Anesthesia Plan  ASA: II  Anesthesia Plan: General   Post-op Pain Management:    Induction: Intravenous  PONV Risk Score and Plan: 2 and Ondansetron and Dexamethasone  Airway Management Planned: LMA  Additional Equipment:   Intra-op Plan:   Post-operative Plan:   Informed Consent: I have reviewed the patients History and Physical, chart, labs and discussed the procedure including the risks, benefits and alternatives for the proposed anesthesia with the patient or authorized representative who has indicated his/her understanding and acceptance.       Plan Discussed with:   Anesthesia Plan Comments:        Anesthesia Quick Evaluation

## 2020-05-27 NOTE — H&P (Signed)
H&P updated 05/27/20.   No changes RRR CTAB  Andre Arias 1945-09-27 [id number]  Referring provider: Dion Body, MD [facility] [place],  [place] [address]     Chief Complaint  Patient presents with  . Urethral pain     HPI: 75 year old male with a remote history of prostate cancer status post prostatectomy (2013 [place]) who presents today for further evaluation of urethral discomfort.  He was seen evaluated by his PCP on 03/07/2020.  At that time, his urinalysis was negative.  His PSA remains undetectable.  Pathology consistent with Gleason 4+3, pT2cN0.  He reports today over the past year or so, he has had several issues.  He describes his issues as a mechanical problem related to his urethra.  He feels that when he does urinate, he has a pressure and pain that radiates through his urethra sometimes under his testicles to the tip of his penis.  He also sometimes feels like he has some pain in his perineum.  This going on for about a year has been really essentially stable.  He also reports he has had a lot of issues with constipation.  He had a colonoscopy last year that showed no hemorrhoids.  History drink plenty of fluids to keep him from getting dehydrated also takes a stool softener.  He reports that when he bears down to have a bowel movement, he sometimes has discomfort in his perineum which radiates to his penis as well.  He also has the same discomfort when he tries to tighten his pelvic floor muscles when he feels like he has to cough or sneeze.  He feels that his stream is relatively strong.  It does not splay.  He does feel like he empties his bladder for the most part.  No significant frequency or urgency.  He reports that he has not had any gross hematuria.  He denies dysuria specifically.  The does not feel like when he has had infections before.  Its different.  He denies any significant  incontinence.  He does mention today that he has had severe erectile dysfunction since his surgery.  He was told that the nerves for supplies erections were cut.  He reports that he was not able to afford sildenafil.  He may be interested in pursuing alternative treatments for this.    PMH:     Past Medical History:  Diagnosis Date  . Cancer [facility])    prostate  . Hypertension     Surgical History:      Past Surgical History:  Procedure Laterality Date  . LYMPHADENECTOMY  12/23/2011   Procedure: LYMPHADENECTOMY;  Surgeon: Molli Hazard, MD;  Location: [facility] ORS;  Service: Urology;  Laterality: Bilateral;  . ROBOT ASSISTED LAPAROSCOPIC RADICAL PROSTATECTOMY  12/23/2011   Procedure: ROBOTIC ASSISTED LAPAROSCOPIC RADICAL PROSTATECTOMY LEVEL 2;  Surgeon: Molli Hazard, MD;  Location: [facility] ORS;  Service: Urology;  Laterality: N/A;      . TONSILLECTOMY     as child    Home Medications:  Allergies as of 03/27/2020   No Known Allergies        Medication List       Accurate as of March 27, 2020  1:06 PM. If you have any questions, ask your nurse or doctor.        STOP taking these medications   bacitracin-neomycin-polymyxin-hydrocortisone 1 % ointment Commonly known as: Cortisporin Stopped by: Hollice Espy, MD   bisacodyl 10 MG suppository  Commonly known as: DULCOLAX Stopped by: Hollice Espy, MD   ciprofloxacin 500 MG tablet Commonly known as: Cipro Stopped by: Hollice Espy, MD   hyoscyamine 0.125 MG tablet Commonly known as: LEVSIN Stopped by: Hollice Espy, MD   oxybutynin 5 MG tablet Commonly known as: DITROPAN Stopped by: Hollice Espy, MD   oxyCODONE-acetaminophen 5-325 MG tablet Commonly known as: Percocet Stopped by: Hollice Espy, MD   senna-docusate 8.6-50 MG tablet Commonly known as: Senokot-S Stopped by: Hollice Espy, MD     TAKE these medications   amLODipine 10 MG tablet Commonly known  as: NORVASC   hydrochlorothiazide 12.5 MG tablet Commonly known as: HYDRODIURIL Take 12.5 mg by mouth daily.   lisinopril 40 MG tablet Commonly known as: ZESTRIL   lisinopril-hydrochlorothiazide 10-12.5 MG tablet Commonly known as: ZESTORETIC Take 1 tablet by mouth every morning.   lovastatin 40 MG tablet Commonly known as: MEVACOR Take 40 mg by mouth daily.   sildenafil 20 MG tablet Commonly known as: REVATIO Take 3-5 tablets as needed 1 hour prior to intercourse Started by: Hollice Espy, MD       Allergies: No Known Allergies  Family History: History reviewed. No pertinent family history.  Social History:  reports that he has never smoked. He has never used smokeless tobacco. He reports that he does not drink alcohol and does not use drugs.   Physical Exam: BP (!) 182/88   Pulse 76   Ht 5\' 8"  (1.727 m)   Wt 200 lb (90.7 kg)   BMI 30.41 kg/m   Constitutional:  Alert and oriented, No acute distress. HEENT: [place] AT, moist mucus membranes.  Trachea midline, no masses. Cardiovascular: No clubbing, cyanosis, or edema. Respiratory: Normal respiratory effort, no increased work of breathing. Skin: No rashes, bruises or suspicious lesions. Neurologic: Grossly intact, no focal deficits, moving all 4 extremities. Psychiatric: Normal mood and affect.  UA today is negative, no evidence of microscopic blood  Assessment & Plan:    1. Urethral pain Small 1 cm bladder stone noted on cysto likely attached to urethral foreign body possibly surgical staple which may have been used to ligate the DVC at the time of prostatectomy  I recommended proceeding to the operating room for cystolitholapaxy as well as urethral foreign body removal.  We discussed risk including risk of bleeding, infection, damage surrounding structures, worsening of urinary incontinence, possible need for postoperative Foley catheter amongst others.  He is agreeable this plan.  All questions  answered.   2. 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  We discussed optimal administration.  We did find him a coupon for sildenafil at [employer] which is around $15 for 90 tablets.  He may need to use up to 5 tablets of once 1 hour prior to intercourse for optimal effect.  He does not believe he will be able to afford injections.  We also discussed the possibility of penile prosthesis.  He will let us know if he like to pursue this in the future.  3. History of prostate cancer NED, PSA remains undetectable    Hollice Espy, MD  [facility] [address], [place], [place] [address] [phone]

## 2020-05-27 NOTE — Anesthesia Postprocedure Evaluation (Signed)
Anesthesia Post Note  Patient: Andre Arias  Procedure(s) Performed: CYSTOSCOPY WITH LITHOLAPAXY (N/A ) CYSTOSCOPY WITH FOREIGN BODY REMOVAL (STAPLE) (N/A )  Patient location during evaluation: PACU Anesthesia Type: General Level of consciousness: awake and alert Pain management: pain level controlled Vital Signs Assessment: post-procedure vital signs reviewed and stable Respiratory status: spontaneous breathing and respiratory function stable Cardiovascular status: stable Anesthetic complications: no   No complications documented.   Last Vitals:  Vitals:   05/27/20 0830 05/27/20 0845  BP: 107/67 128/84  Pulse: 62 73  Resp: (!) 9 17  Temp:    SpO2: 99% 99%    Last Pain:  Vitals:   05/27/20 0845  PainSc: 0-No pain                 Darragh Nay K

## 2020-05-28 ENCOUNTER — Encounter: Payer: Self-pay | Admitting: Urology

## 2020-05-28 ENCOUNTER — Other Ambulatory Visit: Payer: Self-pay | Admitting: Urology

## 2020-05-28 LAB — SURGICAL PATHOLOGY

## 2020-06-28 DIAGNOSIS — E782 Mixed hyperlipidemia: Secondary | ICD-10-CM | POA: Diagnosis not present

## 2020-07-05 DIAGNOSIS — E871 Hypo-osmolality and hyponatremia: Secondary | ICD-10-CM | POA: Diagnosis not present

## 2020-07-05 DIAGNOSIS — E782 Mixed hyperlipidemia: Secondary | ICD-10-CM | POA: Diagnosis not present

## 2020-07-05 DIAGNOSIS — I10 Essential (primary) hypertension: Secondary | ICD-10-CM | POA: Diagnosis not present

## 2020-07-09 ENCOUNTER — Encounter: Payer: Self-pay | Admitting: Urology

## 2020-07-09 ENCOUNTER — Other Ambulatory Visit: Payer: Self-pay

## 2020-07-09 ENCOUNTER — Ambulatory Visit: Payer: Medicare HMO | Admitting: Urology

## 2020-07-09 VITALS — BP 159/79 | HR 80 | Wt 200.0 lb

## 2020-07-09 DIAGNOSIS — Z8546 Personal history of malignant neoplasm of prostate: Secondary | ICD-10-CM | POA: Diagnosis not present

## 2020-07-09 DIAGNOSIS — N210 Calculus in bladder: Secondary | ICD-10-CM

## 2020-07-09 NOTE — Progress Notes (Signed)
07/09/2020 11:55 AM   Farrel Gordon [birth year] [id number]  Referring provider: Dion Body, MD [facility] [place],  [place] [address]  Chief Complaint  Patient presents with  . Routine Post Op    HPI: 75 year old male with urethral foreign body who returns today for follow-up.   personal history of prostate cancer status post prostatectomy who initially presented with urethral pain found to have multiple small bladder stones and surgical's staples within the bladder neck and urethra which were removed 05/27/20.    Foley remained in place 72 hours.  He removed on his own.  He reports today that even with a Foley catheter in place, he immediately felt relief of the nagging discomfort that he has had in his pelvis.  Today, he is extremely pleased.  He no longer has discomfort with voiding or urethral/pelvic pain.  This is completely gone.  He does have some urgency and occasional urge incontinence which was significant after surgery but seems to be improving.  He did buy some diapers for postop because he was concerned about this and now thinks he can just switched out for safety pad.  Overall, no issues.   PMH: Past Medical History:  Diagnosis Date  . Cancer [facility])    prostate  . Elevated cholesterol   . Hypertension     Surgical History: Past Surgical History:  Procedure Laterality Date  . COLONOSCOPY  2020  . CYSTOSCOPY W/ URETERAL STENT REMOVAL N/A 05/27/2020   Procedure: CYSTOSCOPY WITH FOREIGN BODY REMOVAL (STAPLE);  Surgeon: Hollice Espy, MD;  Location: [facility] ORS;  Service: Urology;  Laterality: N/A;  . CYSTOSCOPY WITH LITHOLAPAXY N/A 05/27/2020   Procedure: CYSTOSCOPY WITH LITHOLAPAXY;  Surgeon: Hollice Espy, MD;  Location: [facility] OR[facility];  Service: Urology;  Laterality: N/A;  . LYMPHADENECTOMY  12/23/2011   Procedure: LYMPHADENECTOMY;  Surgeon: Molli Hazard, MD;  Location: [facility];  Service: Urology;  Laterality:  Bilateral;  . ROBOT ASSISTED LAPAROSCOPIC RADICAL PROSTATECTOMY  12/23/2011   Procedure: ROBOTIC ASSISTED LAPAROSCOPIC RADICAL PROSTATECTOMY LEVEL 2;  Surgeon: Molli Hazard, MD;  Location: [facility];  Service: Urology;  Laterality: N/A;      . TONSILLECTOMY     as child    Home Medications:  Allergies as of 07/09/2020   No Known Allergies     Medication List       Accurate as of Jul 09, 2020 11:55 AM. If you have any questions, ask your nurse or doctor.        STOP taking these medications   FIBER CHOICE PREBIOTIC FIBER PO Stopped by: Hollice Espy, MD   hydrochlorothiazide 12.5 MG tablet Commonly known as: HYDRODIURIL Stopped by: Hollice Espy, MD   HYDROcodone-acetaminophen 5-325 MG tablet Commonly known as: NORCO/VICODIN Stopped by: Hollice Espy, MD   oxybutynin 5 MG tablet Commonly known as: DITROPAN Stopped by: Hollice Espy, MD   PREPARATION H EX Stopped by: Hollice Espy, MD     TAKE these medications   amLODipine 10 MG tablet Commonly known as: NORVASC Take 10 mg by mouth daily.   carvedilol 3.125 MG tablet Commonly known as: COREG Take by mouth.   lisinopril 40 MG tablet Commonly known as: ZESTRIL Take 40 mg by mouth daily.   lovastatin 40 MG tablet Commonly known as: MEVACOR Take 40 mg by mouth at bedtime.   multivitamin with minerals Tabs tablet Take 1 tablet by mouth daily.   naproxen sodium 220 MG tablet Commonly known as: ALEVE Take 220  mg by mouth daily as needed (pain).   sildenafil 20 MG tablet Commonly known as: REVATIO Take 3-5 tablets as needed 1 hour prior to intercourse What changed:   how much to take  how to take this  when to take this  additional instructions       Allergies: No Known Allergies  Family History: No family history on file.  Social History:  reports that he has never smoked. He has never used smokeless tobacco. He reports current alcohol use of about 2.0 standard drinks of alcohol  per week. He reports that he does not use drugs.   Physical Exam: BP (!) 159/79   Pulse 80   Wt 200 lb (90.7 kg)   BMI 30.41 kg/m   Constitutional:  Alert and oriented, No acute distress. HEENT: [place] AT, moist mucus membranes.  Trachea midline, no masses. Cardiovascular: No clubbing, cyanosis, or edema. Respiratory: Normal respiratory effort, no increased work of breathing. Skin: No rashes, bruises or suspicious lesions. Neurologic: Grossly intact, no focal deficits, moving all 4 extremities. Psychiatric: Normal mood and affect.  UA today negative  Assessment & Plan:    1. Bladder stone Secondary to urethral foreign body (surgical staples)  All visible staples removed and his urethral pain has resolved  We discussed that if his symptoms recur, would consider repeat cystoscopy of to ensure that he is not having more staples migrate into the urethra  He is having some urgency/urge incontinence which is slowly improving, anticipate continued improvement.  If it fails to resolve, he was advised to let us know at which time we can prescribe him additional OAB meds  - Urinalysis, Complete  2. History of prostate cancer NED, check annually - PSA; Future  F/u 1 year with PSA/ recheck urinary symptoms or sooner if symptoms fail to resolve or worsen  Hollice Espy, MD  [facility] [address] [place], [place] [address] [phone]

## 2020-07-11 LAB — MICROSCOPIC EXAMINATION
Bacteria, UA: NONE SEEN
Epithelial Cells (non renal): NONE SEEN /hpf (ref 0–10)
WBC, UA: NONE SEEN /hpf (ref 0–5)

## 2020-07-11 LAB — URINALYSIS, COMPLETE
Bilirubin, UA: NEGATIVE
Glucose, UA: NEGATIVE
Ketones, UA: NEGATIVE
Leukocytes,UA: NEGATIVE
Nitrite, UA: NEGATIVE
Protein,UA: NEGATIVE
RBC, UA: NEGATIVE
Specific Gravity, UA: 1.02 (ref 1.005–1.030)
Urobilinogen, Ur: 0.2 mg/dL (ref 0.2–1.0)
pH, UA: 6 (ref 5.0–7.5)

## 2020-10-01 DIAGNOSIS — E782 Mixed hyperlipidemia: Secondary | ICD-10-CM | POA: Diagnosis not present

## 2020-10-08 DIAGNOSIS — E782 Mixed hyperlipidemia: Secondary | ICD-10-CM | POA: Diagnosis not present

## 2020-10-08 DIAGNOSIS — I10 Essential (primary) hypertension: Secondary | ICD-10-CM | POA: Diagnosis not present

## 2020-10-08 DIAGNOSIS — Z6830 Body mass index (BMI) 30.0-30.9, adult: Secondary | ICD-10-CM | POA: Diagnosis not present

## 2020-10-08 DIAGNOSIS — E6609 Other obesity due to excess calories: Secondary | ICD-10-CM | POA: Diagnosis not present

## 2021-01-21 DIAGNOSIS — I10 Essential (primary) hypertension: Secondary | ICD-10-CM | POA: Diagnosis not present

## 2021-01-21 DIAGNOSIS — E782 Mixed hyperlipidemia: Secondary | ICD-10-CM | POA: Diagnosis not present

## 2021-01-28 DIAGNOSIS — I10 Essential (primary) hypertension: Secondary | ICD-10-CM | POA: Diagnosis not present

## 2021-01-28 DIAGNOSIS — Z0000 Encounter for general adult medical examination without abnormal findings: Secondary | ICD-10-CM | POA: Diagnosis not present

## 2021-01-28 DIAGNOSIS — Z1389 Encounter for screening for other disorder: Secondary | ICD-10-CM | POA: Diagnosis not present

## 2021-01-28 DIAGNOSIS — E785 Hyperlipidemia, unspecified: Secondary | ICD-10-CM | POA: Diagnosis not present

## 2021-07-09 ENCOUNTER — Other Ambulatory Visit: Payer: Self-pay

## 2021-07-15 ENCOUNTER — Ambulatory Visit: Payer: Self-pay | Admitting: Urology

## 2021-07-22 DIAGNOSIS — E782 Mixed hyperlipidemia: Secondary | ICD-10-CM | POA: Diagnosis not present

## 2021-07-29 DIAGNOSIS — B001 Herpesviral vesicular dermatitis: Secondary | ICD-10-CM | POA: Diagnosis not present

## 2021-07-29 DIAGNOSIS — I10 Essential (primary) hypertension: Secondary | ICD-10-CM | POA: Diagnosis not present

## 2021-07-29 DIAGNOSIS — G629 Polyneuropathy, unspecified: Secondary | ICD-10-CM | POA: Diagnosis not present

## 2021-07-29 DIAGNOSIS — E782 Mixed hyperlipidemia: Secondary | ICD-10-CM | POA: Diagnosis not present

## 2021-07-29 DIAGNOSIS — R109 Unspecified abdominal pain: Secondary | ICD-10-CM | POA: Diagnosis not present

## 2021-11-11 DIAGNOSIS — Z0100 Encounter for examination of eyes and vision without abnormal findings: Secondary | ICD-10-CM | POA: Diagnosis not present

## 2021-11-11 DIAGNOSIS — H2513 Age-related nuclear cataract, bilateral: Secondary | ICD-10-CM | POA: Diagnosis not present

## 2021-11-12 DIAGNOSIS — H524 Presbyopia: Secondary | ICD-10-CM | POA: Diagnosis not present

## 2022-01-23 DIAGNOSIS — E782 Mixed hyperlipidemia: Secondary | ICD-10-CM | POA: Diagnosis not present

## 2022-01-23 DIAGNOSIS — I10 Essential (primary) hypertension: Secondary | ICD-10-CM | POA: Diagnosis not present

## 2022-01-30 DIAGNOSIS — Z1331 Encounter for screening for depression: Secondary | ICD-10-CM | POA: Diagnosis not present

## 2022-01-30 DIAGNOSIS — Z0000 Encounter for general adult medical examination without abnormal findings: Secondary | ICD-10-CM | POA: Diagnosis not present

## 2022-01-30 DIAGNOSIS — I10 Essential (primary) hypertension: Secondary | ICD-10-CM | POA: Diagnosis not present

## 2022-01-30 DIAGNOSIS — R251 Tremor, unspecified: Secondary | ICD-10-CM | POA: Diagnosis not present

## 2022-01-30 DIAGNOSIS — E785 Hyperlipidemia, unspecified: Secondary | ICD-10-CM | POA: Diagnosis not present

## 2022-07-28 DIAGNOSIS — E782 Mixed hyperlipidemia: Secondary | ICD-10-CM | POA: Diagnosis not present

## 2022-07-28 DIAGNOSIS — Z862 Personal history of diseases of the blood and blood-forming organs and certain disorders involving the immune mechanism: Secondary | ICD-10-CM | POA: Diagnosis not present

## 2022-08-04 DIAGNOSIS — Z862 Personal history of diseases of the blood and blood-forming organs and certain disorders involving the immune mechanism: Secondary | ICD-10-CM | POA: Diagnosis not present

## 2022-08-04 DIAGNOSIS — E782 Mixed hyperlipidemia: Secondary | ICD-10-CM | POA: Diagnosis not present

## 2022-08-04 DIAGNOSIS — I10 Essential (primary) hypertension: Secondary | ICD-10-CM | POA: Diagnosis not present

## 2022-10-27 DIAGNOSIS — Z862 Personal history of diseases of the blood and blood-forming organs and certain disorders involving the immune mechanism: Secondary | ICD-10-CM | POA: Diagnosis not present

## 2022-10-27 DIAGNOSIS — I10 Essential (primary) hypertension: Secondary | ICD-10-CM | POA: Diagnosis not present

## 2022-10-27 DIAGNOSIS — E782 Mixed hyperlipidemia: Secondary | ICD-10-CM | POA: Diagnosis not present

## 2022-11-03 DIAGNOSIS — I10 Essential (primary) hypertension: Secondary | ICD-10-CM | POA: Diagnosis not present

## 2022-11-03 DIAGNOSIS — Z862 Personal history of diseases of the blood and blood-forming organs and certain disorders involving the immune mechanism: Secondary | ICD-10-CM | POA: Diagnosis not present

## 2022-11-03 DIAGNOSIS — E782 Mixed hyperlipidemia: Secondary | ICD-10-CM | POA: Diagnosis not present

## 2023-02-01 DIAGNOSIS — E782 Mixed hyperlipidemia: Secondary | ICD-10-CM | POA: Diagnosis not present

## 2023-02-01 DIAGNOSIS — Z862 Personal history of diseases of the blood and blood-forming organs and certain disorders involving the immune mechanism: Secondary | ICD-10-CM | POA: Diagnosis not present

## 2023-02-08 DIAGNOSIS — Z0000 Encounter for general adult medical examination without abnormal findings: Secondary | ICD-10-CM | POA: Diagnosis not present

## 2023-02-08 DIAGNOSIS — Z1331 Encounter for screening for depression: Secondary | ICD-10-CM | POA: Diagnosis not present

## 2023-02-08 DIAGNOSIS — D649 Anemia, unspecified: Secondary | ICD-10-CM | POA: Diagnosis not present

## 2023-02-08 DIAGNOSIS — I10 Essential (primary) hypertension: Secondary | ICD-10-CM | POA: Diagnosis not present

## 2023-02-08 DIAGNOSIS — Z23 Encounter for immunization: Secondary | ICD-10-CM | POA: Diagnosis not present

## 2023-02-08 DIAGNOSIS — E785 Hyperlipidemia, unspecified: Secondary | ICD-10-CM | POA: Diagnosis not present

## 2023-08-03 DIAGNOSIS — Z09 Encounter for follow-up examination after completed treatment for conditions other than malignant neoplasm: Secondary | ICD-10-CM | POA: Diagnosis not present

## 2023-08-03 DIAGNOSIS — E782 Mixed hyperlipidemia: Secondary | ICD-10-CM | POA: Diagnosis not present

## 2023-08-03 DIAGNOSIS — Z862 Personal history of diseases of the blood and blood-forming organs and certain disorders involving the immune mechanism: Secondary | ICD-10-CM | POA: Diagnosis not present

## 2023-08-10 DIAGNOSIS — R5383 Other fatigue: Secondary | ICD-10-CM | POA: Diagnosis not present

## 2023-08-10 DIAGNOSIS — I10 Essential (primary) hypertension: Secondary | ICD-10-CM | POA: Diagnosis not present

## 2023-08-10 DIAGNOSIS — Z862 Personal history of diseases of the blood and blood-forming organs and certain disorders involving the immune mechanism: Secondary | ICD-10-CM | POA: Diagnosis not present

## 2023-08-10 DIAGNOSIS — E782 Mixed hyperlipidemia: Secondary | ICD-10-CM | POA: Diagnosis not present
